# Patient Record
Sex: Female | Born: 1960
Health system: Southern US, Community
[De-identification: ages and names within clinical notes are randomized; demographics above are authoritative.]

## PROBLEM LIST (undated history)

## (undated) DIAGNOSIS — C801 Malignant (primary) neoplasm, unspecified: Secondary | ICD-10-CM

## (undated) DIAGNOSIS — E785 Hyperlipidemia, unspecified: Secondary | ICD-10-CM

## (undated) DIAGNOSIS — T7840XA Allergy, unspecified, initial encounter: Secondary | ICD-10-CM

## (undated) DIAGNOSIS — I10 Essential (primary) hypertension: Secondary | ICD-10-CM

## (undated) DIAGNOSIS — L719 Rosacea, unspecified: Secondary | ICD-10-CM

## (undated) DIAGNOSIS — E119 Type 2 diabetes mellitus without complications: Secondary | ICD-10-CM

## (undated) HISTORY — DX: Type 2 diabetes mellitus without complications: E11.9

## (undated) HISTORY — DX: Rosacea, unspecified: L71.9

## (undated) HISTORY — DX: Essential (primary) hypertension: I10

## (undated) HISTORY — DX: Hyperlipidemia, unspecified: E78.5

## (undated) HISTORY — DX: Allergy, unspecified, initial encounter: T78.40XA

---

## 1992-03-11 HISTORY — PX: CHOLECYSTECTOMY: SHX55

## 2000-06-02 ENCOUNTER — Other Ambulatory Visit: Admission: RE | Admit: 2000-06-02 | Discharge: 2000-06-02 | Payer: Self-pay | Admitting: Internal Medicine

## 2000-06-17 ENCOUNTER — Encounter: Admission: RE | Admit: 2000-06-17 | Discharge: 2000-09-15 | Payer: Self-pay | Admitting: Internal Medicine

## 2001-10-12 ENCOUNTER — Encounter: Payer: Self-pay | Admitting: *Deleted

## 2001-10-12 ENCOUNTER — Encounter: Admission: RE | Admit: 2001-10-12 | Discharge: 2001-10-12 | Payer: Self-pay | Admitting: *Deleted

## 2004-08-30 ENCOUNTER — Ambulatory Visit (HOSPITAL_COMMUNITY): Admission: RE | Admit: 2004-08-30 | Discharge: 2004-08-30 | Payer: Self-pay | Admitting: Internal Medicine

## 2005-02-12 ENCOUNTER — Other Ambulatory Visit: Admission: RE | Admit: 2005-02-12 | Discharge: 2005-02-12 | Payer: Self-pay | Admitting: Internal Medicine

## 2005-10-25 ENCOUNTER — Ambulatory Visit: Payer: Self-pay | Admitting: Family Medicine

## 2005-11-09 ENCOUNTER — Ambulatory Visit: Payer: Self-pay | Admitting: Family Medicine

## 2005-12-09 ENCOUNTER — Ambulatory Visit: Payer: Self-pay | Admitting: Family Medicine

## 2006-10-21 ENCOUNTER — Ambulatory Visit: Payer: Self-pay

## 2008-06-08 ENCOUNTER — Ambulatory Visit: Payer: Self-pay | Admitting: Family Medicine

## 2010-03-07 ENCOUNTER — Ambulatory Visit: Payer: Self-pay

## 2010-10-03 ENCOUNTER — Ambulatory Visit: Payer: Self-pay | Admitting: Family Medicine

## 2011-03-02 ENCOUNTER — Ambulatory Visit: Payer: Self-pay | Admitting: Internal Medicine

## 2011-09-25 ENCOUNTER — Ambulatory Visit: Payer: Self-pay | Admitting: Family Medicine

## 2014-10-03 ENCOUNTER — Ambulatory Visit: Payer: Self-pay

## 2014-10-03 NOTE — Patient Outreach (Signed)
[facility]) Care Management  10/03/2014  Carolyn Levine 1960-05-09 [id number]   Patient sent an e-mail cancelling today's visit because of a conflict with her work schedule- she would like to reschedule.    Gentry Fitz, RN, BA, [place], [place] Direct Dial:  [phone]  Fax:  [phone] E-mail: [email or web address] [address], [place], [place]  [address]

## 2014-10-25 ENCOUNTER — Other Ambulatory Visit: Payer: Self-pay

## 2014-10-25 NOTE — Patient Outreach (Signed)
[facility]) Care Management  10/25/2014  Carolyn Levine 11-Jan-1961 [id number]   Sent an e-mail this morning requesting Carolyn Levine reschedule her visit from 10/03/14 that she had to reschedule.   Gentry Fitz, RN, BA, [place], [place] Direct Dial:  [phone]  Fax:  [phone] E-mail: [name] [address], [place], [place]  [address]

## 2015-01-09 ENCOUNTER — Encounter: Payer: Self-pay | Admitting: Physician Assistant

## 2015-01-09 ENCOUNTER — Ambulatory Visit: Payer: Self-pay | Admitting: Physician Assistant

## 2015-01-09 VITALS — BP 134/80 | HR 76 | Temp 98.1°F

## 2015-01-09 DIAGNOSIS — J019 Acute sinusitis, unspecified: Secondary | ICD-10-CM

## 2015-01-09 MED ORDER — AMOXICILLIN 875 MG PO TABS: 875.0000 mg | ORAL_TABLET | Freq: Two times a day (BID) | ORAL | Status: DC

## 2015-01-09 NOTE — Progress Notes (Signed)
S: C/o runny nose and congestion for 3 days,r eye was matted with green mucus this morning, no drainage from eye later today;  no fever, chills, cp/sob, v/d; mucus is green and thick, c/o of facial and dental pain.   Using otc meds:   O: PE: vitals wnl, nad, perrl eomi, normocephalic, tms dull, nasal mucosa red and swollen, throat injected, neck supple no lymph, lungs c t a, cv rrr, neuro intact  A:  Acute sinusitis   P: amoxil 875mg  bid x 10d drink fluids, continue regular meds , use otc meds of choice, return if not improving in 5 days, return earlier if worsening

## 2015-02-14 ENCOUNTER — Other Ambulatory Visit: Payer: Self-pay

## 2015-02-14 NOTE — Patient Outreach (Signed)
[facility]) Care Management  02/14/2015  Carolyn Levine 20-Dec-1960 [id number]   I have sent an e-mail to Lisi requesting she follow up with me- I have some appointments available on December 28th.     Gentry Fitz, RN, BA, [place], [place] Direct Dial:  [phone]  Fax:  [phone] E-mail: [email or web address] [address] [place], [place]  [address]

## 2015-03-05 ENCOUNTER — Telehealth: Payer: 59 | Admitting: Nurse Practitioner

## 2015-03-05 DIAGNOSIS — J0100 Acute maxillary sinusitis, unspecified: Secondary | ICD-10-CM | POA: Diagnosis not present

## 2015-03-05 MED ORDER — AMOXICILLIN-POT CLAVULANATE 875-125 MG PO TABS: 1.0000 | ORAL_TABLET | Freq: Two times a day (BID) | ORAL | Status: DC

## 2015-03-05 NOTE — Progress Notes (Signed)

## 2015-03-08 ENCOUNTER — Other Ambulatory Visit: Payer: Self-pay

## 2015-03-08 VITALS — BP 146/76 | Ht 65.0 in | Wt 307.0 lb

## 2015-03-08 DIAGNOSIS — E785 Hyperlipidemia, unspecified: Secondary | ICD-10-CM | POA: Insufficient documentation

## 2015-03-08 DIAGNOSIS — IMO0001 Reserved for inherently not codable concepts without codable children: Secondary | ICD-10-CM

## 2015-03-08 DIAGNOSIS — E1165 Type 2 diabetes mellitus with hyperglycemia: Principal | ICD-10-CM

## 2015-03-08 DIAGNOSIS — E1169 Type 2 diabetes mellitus with other specified complication: Secondary | ICD-10-CM | POA: Insufficient documentation

## 2015-03-08 DIAGNOSIS — E119 Type 2 diabetes mellitus without complications: Secondary | ICD-10-CM | POA: Insufficient documentation

## 2015-03-08 NOTE — Patient Outreach (Signed)
[facility]) Care Management  [place]  03/08/2015   Carolyn Levine May 02, 1960 [id number]  Subjective: Carolyn Levine was in for her reguarly scheduled Link to Wellness visit.  She saw MD in October and her A1C was slightly elevated at 7.2%- she reports a direct correlation between her decreased exercise and a higher A1C.  She has already developed an exercise program with her co-workers and has joined the [employer]. She has been cleared by her MD and plans on going to exercise classes 2x/week and exercising on her own, two times per week. She is aiming for 30 minutes each time.  Lately she has been walking 1/2 mile with her dog in the morning and 1/2 mile on her own- sometimes she walks after work as well but she reports not being as diligent about exercising.  She has no complaints of pain or depression but she currently has a cold and an ear infection- she is on Augmentin.   Objective:  Filed Vitals:   03/08/15 0841  BP: 146/76  Height: 1.651 m (5\' 5" )  Weight: 307 lb (139.254 kg)     Current Medications:  Current Outpatient Prescriptions  Medication Sig Dispense Refill  . amoxicillin-clavulanate (AUGMENTIN) 875-125 MG tablet Take 1 tablet by mouth 2 (two) times daily. 20 tablet 0  . lisinopril (PRINIVIL,ZESTRIL) 10 MG tablet Take 10 mg by mouth daily.    . metFORMIN (GLUCOPHAGE) 1000 MG tablet Take 1,000 mg by mouth 2 (two) times daily with a meal.    . rosuvastatin (CRESTOR) 5 MG tablet Take 5 mg by mouth daily. Takes every three days    . amoxicillin (AMOXIL) 875 MG tablet Take 1 tablet (875 mg total) by mouth 2 (two) times daily. (Patient not taking: Reported on 03/08/2015) 20 tablet 0   No current facility-administered medications for this visit.    Functional Status:  In your present state of health, do you have any difficulty performing the following activities: 03/08/2015  Hearing? N  Vision? N  Difficulty concentrating or making decisions? N  Walking  or climbing stairs? N  Dressing or bathing? N  Doing errands, shopping? N    Fall/Depression Screening: PHQ 2/9 Scores 03/08/2015 12/15/2013  PHQ - 2 Score 0 0    Assessment: Margory has proactively made some changes to help lower her A1C.  She continues to drink unsweetened drinks and bring her lunch and snacks to work. She tries to eat a well-balanced diet paying attention to carbs and proteins.  She has started to keep track of consuming 4- 16 oz glasses of water every day. She has completed her HCPOA.   Plan:  [facility] CM Care Plan Problem One        Most Recent Value   Care Plan Problem One  Elevated A1C greater than goal of 7%   Role Documenting the Problem One  Care Management [place] for Problem One  Active   [facility] Long Term Goal (31-90 days)  Obtain an A1C of 7% or less when rechecked at next MD visit in the spring 2017   [place] Term Goal Start Date  03/08/15   Interventions for Problem One Long Term Goal  1. exercise 4x/week for at least 30 minutes- 2 clesses and 2x on your own 2. drink 4- 16 oz glasses of water per day     Follow up in 6 months.   Gentry Fitz, RN, BA, [place], [place] Diabetes Coordinator- [employer]  To Wellness Direct Dial:  [phone]  Fax:  [phone] E-mail: [email or web address] [address] [place], [place]  [address]

## 2015-03-13 ENCOUNTER — Telehealth: Payer: 59 | Admitting: Family

## 2015-03-13 DIAGNOSIS — B373 Candidiasis of vulva and vagina: Secondary | ICD-10-CM

## 2015-03-13 DIAGNOSIS — B3731 Acute candidiasis of vulva and vagina: Secondary | ICD-10-CM

## 2015-03-13 MED ORDER — FLUCONAZOLE 150 MG PO TABS: 150.0000 mg | ORAL_TABLET | Freq: Once | ORAL | Status: DC

## 2015-03-13 NOTE — Progress Notes (Signed)

## 2015-03-31 DIAGNOSIS — E119 Type 2 diabetes mellitus without complications: Secondary | ICD-10-CM | POA: Diagnosis not present

## 2015-05-26 ENCOUNTER — Ambulatory Visit: Payer: Self-pay | Admitting: Physician Assistant

## 2015-05-26 ENCOUNTER — Encounter: Payer: Self-pay | Admitting: Physician Assistant

## 2015-05-26 VITALS — BP 154/80 | HR 88 | Temp 99.8°F

## 2015-05-26 DIAGNOSIS — J069 Acute upper respiratory infection, unspecified: Secondary | ICD-10-CM

## 2015-05-26 DIAGNOSIS — R509 Fever, unspecified: Secondary | ICD-10-CM

## 2015-05-26 LAB — POCT INFLUENZA A/B
Influenza A, POC: NEGATIVE
Influenza B, POC: NEGATIVE

## 2015-05-26 MED ORDER — AMOXICILLIN-POT CLAVULANATE 875-125 MG PO TABS: 1.0000 | ORAL_TABLET | Freq: Two times a day (BID) | ORAL | Status: DC

## 2015-05-26 MED ORDER — HYDROCOD POLST-CPM POLST ER 10-8 MG/5ML PO SUER: 5.0000 mL | Freq: Two times a day (BID) | ORAL | Status: DC | PRN

## 2015-05-26 MED ORDER — FLUCONAZOLE 150 MG PO TABS: 150.0000 mg | ORAL_TABLET | Freq: Once | ORAL | Status: DC

## 2015-05-26 NOTE — Progress Notes (Signed)
S: C/o runny nose and congestion with dry cough for 3 days, + fever, chills, denies cp/sob, v/d; mucus was green this am but clear throughout the day, cough is sporadic, nephew stayed with them last weekend and has the flu  Using otc meds: robitussin  O: PE: vitals wnl, nad,  perrl eomi, normocephalic, tms dull, nasal mucosa red and swollen, throat injected, neck supple no lymph, lungs c t a, cv rrr, neuro intact, flu swab neg  A:  Acute flu like illness   P: trial of augmentin 875mg  , tussionex 138ml nr, diflucan; drink fluids, continue regular meds , use otc meds of choice, return if not improving in 5 days, return earlier if worsening

## 2015-08-15 ENCOUNTER — Other Ambulatory Visit: Payer: Self-pay

## 2015-08-15 VITALS — BP 124/70 | HR 78 | Ht 65.0 in | Wt 301.3 lb

## 2015-08-15 DIAGNOSIS — E1165 Type 2 diabetes mellitus with hyperglycemia: Principal | ICD-10-CM

## 2015-08-15 DIAGNOSIS — IMO0001 Reserved for inherently not codable concepts without codable children: Secondary | ICD-10-CM

## 2015-08-15 NOTE — Patient Outreach (Signed)
[facility]) Care Management  [place]  08/15/2015   Carolyn Levine 26-Aug-1960 [id number]  Subjective: Patient in for her routine Link to Wellness visit- she reports fasting blood sugars of 120-135mg /dl and 2 hour post prandial blood sugars of 130-161mg /dl.  She tells me she's going to [facility] for vacation and is increasing her stamina by exercising most morning for 20 minutes and most evenings for 20 minutes.  She has no complaints.  She will have labs drawn tomorrow and see the MD next week. She tries to eat following the diabetic guidelines and motivates/encourages others to keep her workplace void of poor dietary choices.  Objective:  Filed Vitals:   08/15/15 0837 08/15/15 0840  BP: 124/70   Pulse: 78   Height: 1.651 m (5\' 5" )   Weight: 301 lb 4.8 oz (136.669 kg)   SpO2:  98%   Visible weight loss- down from 307lbs in December 2016.  Encounter Medications:  Outpatient Encounter Prescriptions as of 08/15/2015  Medication Sig  . lisinopril (PRINIVIL,ZESTRIL) 10 MG tablet Take 10 mg by mouth daily.  . metFORMIN (GLUCOPHAGE) 1000 MG tablet Take 1,000 mg by mouth 2 (two) times daily with a meal.  . rosuvastatin (CRESTOR) 5 MG tablet Take 5 mg by mouth daily. Takes every three days  . amoxicillin-clavulanate (AUGMENTIN) 875-125 MG tablet Take 1 tablet by mouth 2 (two) times daily. for 10 days (Patient not taking: Reported on 08/15/2015)  . chlorpheniramine-HYDROcodone (TUSSIONEX PENNKINETIC ER) 10-8 MG/5ML SUER Take 5 mLs by mouth every 12 (twelve) hours as needed for cough. (Patient not taking: Reported on 08/15/2015)  . fluconazole (DIFLUCAN) 150 MG tablet Take 1 tablet (150 mg total) by mouth once. (Patient not taking: Reported on 08/15/2015)   No facility-administered encounter medications on file as of 08/15/2015.    Functional Status:  In your present state of health, do you have any difficulty performing the following activities: 08/15/2015 03/08/2015  Hearing?  N N  Vision? N N  Difficulty concentrating or making decisions? N N  Walking or climbing stairs? N N  Dressing or bathing? N N  Doing errands, shopping? N N    Fall/Depression Screening: PHQ 2/9 Scores 08/15/2015 03/08/2015 12/15/2013  PHQ - 2 Score 0 0 0    Assessment: Currently motivated and appears to be making healthy choices- drinking plenty of water, 1 diet soda per day and consistent exercise. She does question wether she might have sleep apnea and will discuss with her MD at her next visit. (she complains of being tired but has been working 7am to 5:30pm most days)  Plan:  [facility] CM Care Plan Problem One        Most Recent Value   Care Plan Problem One  Elevated A1C greater than goal of 7%   Role Documenting the Problem One  Care Management [place] for Problem One  Active   [facility] Long Term Goal (31-90 days)  Obtain an A1C of 7% or less when rechecked at next MD visit in the spring 2017   [place] Term Goal Start Date  03/08/15 [missed March appointment- rescheduled to next week]   Interventions for Problem One Long Term Goal  1. exercise 4x/week for at least 30 minutes- drink 16 oz glasses of water per day     Schedule mammogram and OB/Gyn visit Continue to exercise most evenings but increase each week by 5 minutes/day. Follow up with me in 6 months.   Gentry Fitz,  RN, [place], [place], [place]:  [phone]  Fax:  [phone] E-mail: [email or web address] [address], [place], [place]  [address]

## 2015-08-16 ENCOUNTER — Other Ambulatory Visit: Payer: Self-pay | Admitting: Family Medicine

## 2015-08-16 DIAGNOSIS — E119 Type 2 diabetes mellitus without complications: Secondary | ICD-10-CM | POA: Diagnosis not present

## 2015-08-16 DIAGNOSIS — E785 Hyperlipidemia, unspecified: Secondary | ICD-10-CM | POA: Diagnosis not present

## 2015-08-16 DIAGNOSIS — Z1231 Encounter for screening mammogram for malignant neoplasm of breast: Secondary | ICD-10-CM

## 2015-08-22 DIAGNOSIS — I10 Essential (primary) hypertension: Secondary | ICD-10-CM | POA: Diagnosis not present

## 2015-08-22 DIAGNOSIS — E1165 Type 2 diabetes mellitus with hyperglycemia: Secondary | ICD-10-CM | POA: Diagnosis not present

## 2015-08-22 DIAGNOSIS — G4700 Insomnia, unspecified: Secondary | ICD-10-CM | POA: Diagnosis not present

## 2015-08-22 DIAGNOSIS — E785 Hyperlipidemia, unspecified: Secondary | ICD-10-CM | POA: Diagnosis not present

## 2015-08-29 ENCOUNTER — Ambulatory Visit
Admission: RE | Admit: 2015-08-29 | Discharge: 2015-08-29 | Disposition: A | Discharge status: Home / Self Care | Payer: 59 | Source: Ambulatory Visit | Attending: Family Medicine | Admitting: Family Medicine

## 2015-08-29 ENCOUNTER — Other Ambulatory Visit: Payer: Self-pay | Admitting: Family Medicine

## 2015-08-29 DIAGNOSIS — Z1231 Encounter for screening mammogram for malignant neoplasm of breast: Secondary | ICD-10-CM

## 2016-01-18 DIAGNOSIS — E1165 Type 2 diabetes mellitus with hyperglycemia: Secondary | ICD-10-CM | POA: Diagnosis not present

## 2016-01-24 DIAGNOSIS — Z6841 Body Mass Index (BMI) 40.0 and over, adult: Secondary | ICD-10-CM | POA: Diagnosis not present

## 2016-01-24 DIAGNOSIS — I10 Essential (primary) hypertension: Secondary | ICD-10-CM | POA: Diagnosis not present

## 2016-01-24 DIAGNOSIS — E1165 Type 2 diabetes mellitus with hyperglycemia: Secondary | ICD-10-CM | POA: Diagnosis not present

## 2016-06-04 ENCOUNTER — Encounter: Payer: Self-pay | Admitting: Physician Assistant

## 2016-06-04 ENCOUNTER — Ambulatory Visit: Payer: Self-pay | Admitting: Physician Assistant

## 2016-06-04 VITALS — BP 138/70 | HR 97 | Temp 97.9°F

## 2016-06-04 DIAGNOSIS — I10 Essential (primary) hypertension: Secondary | ICD-10-CM | POA: Insufficient documentation

## 2016-06-04 DIAGNOSIS — E669 Obesity, unspecified: Secondary | ICD-10-CM | POA: Insufficient documentation

## 2016-06-04 DIAGNOSIS — D649 Anemia, unspecified: Secondary | ICD-10-CM | POA: Insufficient documentation

## 2016-06-04 DIAGNOSIS — J0100 Acute maxillary sinusitis, unspecified: Secondary | ICD-10-CM

## 2016-06-04 DIAGNOSIS — E785 Hyperlipidemia, unspecified: Secondary | ICD-10-CM | POA: Insufficient documentation

## 2016-06-04 NOTE — Progress Notes (Signed)
38/70 

## 2016-06-04 NOTE — Progress Notes (Signed)
S: C/o runny nose and congestion for 4 days, no fever, chills, cp/sob, v/d; mucus is yellow and thick when she is able to get it out, cough is sporadic, worse at night, tussionex didn't help much last night; delsym seems to be helping this am  Using otc meds: delsym  O: PE: vitals wnl, nad, perrl eomi, normocephalic, tms dull, nasal mucosa red and swollen, throat injected, neck supple no lymph, lungs c t a, cv rrr, neuro intact  A:  Acute sinusitis   P: drink fluids, continue regular meds , use otc meds of choice, return if not improving in 5 days, return earlier if worsening, if not better by Thursday will call in an antibiotic

## 2016-07-03 DIAGNOSIS — E1165 Type 2 diabetes mellitus with hyperglycemia: Secondary | ICD-10-CM | POA: Diagnosis not present

## 2016-07-10 DIAGNOSIS — I10 Essential (primary) hypertension: Secondary | ICD-10-CM | POA: Diagnosis not present

## 2016-07-10 DIAGNOSIS — E119 Type 2 diabetes mellitus without complications: Secondary | ICD-10-CM | POA: Diagnosis not present

## 2016-07-10 DIAGNOSIS — E785 Hyperlipidemia, unspecified: Secondary | ICD-10-CM | POA: Diagnosis not present

## 2017-02-03 NOTE — Patient Outreach (Signed)
[facility]) Care Management  02/03/2017  Carolyn Levine Aug 24, 1960 [id number]   I have removed myself from the care team for Link to Wellness.  I will continue to assist Carolyn Levine in managing her diabetes through the [place] called [place].   Gentry Fitz, RN, BA, [place], [place] Direct Dial:  [phone]  Fax:  [phone] E-mail: [email or web address] [address] [place], [place]  [address]

## 2017-05-26 DIAGNOSIS — H0011 Chalazion right upper eyelid: Secondary | ICD-10-CM | POA: Diagnosis not present

## 2017-07-16 DIAGNOSIS — E119 Type 2 diabetes mellitus without complications: Secondary | ICD-10-CM | POA: Diagnosis not present

## 2017-09-01 DIAGNOSIS — I10 Essential (primary) hypertension: Secondary | ICD-10-CM | POA: Diagnosis not present

## 2017-09-01 DIAGNOSIS — E119 Type 2 diabetes mellitus without complications: Secondary | ICD-10-CM | POA: Diagnosis not present

## 2017-09-01 DIAGNOSIS — E785 Hyperlipidemia, unspecified: Secondary | ICD-10-CM | POA: Diagnosis not present

## 2017-09-03 DIAGNOSIS — E1165 Type 2 diabetes mellitus with hyperglycemia: Secondary | ICD-10-CM | POA: Diagnosis not present

## 2017-09-03 DIAGNOSIS — E785 Hyperlipidemia, unspecified: Secondary | ICD-10-CM | POA: Diagnosis not present

## 2017-09-03 DIAGNOSIS — I10 Essential (primary) hypertension: Secondary | ICD-10-CM | POA: Diagnosis not present

## 2017-09-10 ENCOUNTER — Other Ambulatory Visit: Payer: Self-pay | Admitting: Family Medicine

## 2017-09-10 DIAGNOSIS — Z1231 Encounter for screening mammogram for malignant neoplasm of breast: Secondary | ICD-10-CM

## 2017-09-30 ENCOUNTER — Ambulatory Visit
Admission: RE | Admit: 2017-09-30 | Discharge: 2017-09-30 | Disposition: A | Discharge status: Home / Self Care | Payer: 59 | Source: Ambulatory Visit | Attending: Family Medicine | Admitting: Family Medicine

## 2017-09-30 DIAGNOSIS — Z1231 Encounter for screening mammogram for malignant neoplasm of breast: Secondary | ICD-10-CM | POA: Diagnosis not present

## 2018-01-19 ENCOUNTER — Ambulatory Visit (INDEPENDENT_AMBULATORY_CARE_PROVIDER_SITE_OTHER): Payer: Self-pay | Admitting: Physician Assistant

## 2018-01-19 VITALS — BP 130/90 | HR 95 | Temp 98.2°F | Resp 18 | Wt 299.0 lb

## 2018-01-19 DIAGNOSIS — R05 Cough: Secondary | ICD-10-CM

## 2018-01-19 DIAGNOSIS — H1031 Unspecified acute conjunctivitis, right eye: Secondary | ICD-10-CM

## 2018-01-19 DIAGNOSIS — R059 Cough, unspecified: Secondary | ICD-10-CM

## 2018-01-19 DIAGNOSIS — J069 Acute upper respiratory infection, unspecified: Secondary | ICD-10-CM

## 2018-01-19 MED ORDER — POLYMYXIN B-TRIMETHOPRIM 10000-0.1 UNIT/ML-% OP SOLN: 2.0000 [drp] | Freq: Four times a day (QID) | OPHTHALMIC | 0 refills | Status: AC

## 2018-01-19 MED ORDER — ALBUTEROL SULFATE HFA 108 (90 BASE) MCG/ACT IN AERS: 2.0000 | INHALATION_SPRAY | RESPIRATORY_TRACT | 0 refills | Status: DC | PRN

## 2018-01-19 MED ORDER — GUAIFENESIN-DM 100-10 MG/5ML PO SYRP: 5.0000 mL | ORAL_SOLUTION | ORAL | 0 refills | Status: DC | PRN

## 2018-01-19 NOTE — Progress Notes (Signed)
Patient ID: ALYCE INSCORE DOB: [birth year] AGE: 57 y.o. MRN: [id number]   PCP: [name], MD   Chief Complaint:  Chief Complaint  Patient presents with  . cough, drainagex5     Subjective:    HPI:  Carolyn Levine is a 57 y.o. female presents for evaluation  Chief Complaint  Patient presents with  . cough, drainagex71    57 year old female presents to [facility] with six day history of URI symptoms. Began with nasal congestion and rhinorrhea. Patient used NettiPot. Over the weekend developed chills and cough. Cough coarse and productive. Episode of audible wheezing. Cough worse in the evening; causing difficulty sleeping. Cough also triggered by talking. Not triggered by physical exertion; no associated shortness of breath. Patient yesterday and today developed right eye discharge with associated pressure/swollen sensation. Reports right eye pruritis and burning sensation. Also bilateral ear fullness/pressure. Has taken over the counter Dayquil and used previously prescribed Tessalon Perles with no improvement. Denies fever, headache, ear pain, sinus pain, sore throat, chest pain. No asthma history. Patient denies change in vision, foreign body sensation, significant eye pain/discomfort, photophobia.  Patient on physical examination has resolving/scabbed-over cold sore on midline lower lip. States presented few days prior to onset of cold symptoms. Patient used over the counter Blistex. No previous history of herpes simplex. Husband with history of herpes simplex.  Patient with non-insulin dependent diabetes mellitus. Last A1C one week ago, 6.3. Controlled on Metformin.  A complete, at least 10 system review of symptoms was performed, pertinent positives and negatives as mentioned in HPI, otherwise negative.  The following portions of the patient's history were reviewed and updated as appropriate: allergies, current medications and past medical history.  Patient  Active Problem List   Diagnosis Date Noted  . Hyperlipidemia, unspecified 06/04/2016  . Hypertension 06/04/2016  . Obesity 06/04/2016  . Anemia 06/04/2016  . Type 2 diabetes mellitus ([place]) 03/08/2015    Allergies  Allergen Reactions  . Codeine Sulfate   . Simvastatin     Other reaction(s): Muscle Pain  . Sulphadimidine [Sulfamethazine]     Current Outpatient Medications on File Prior to Visit  Medication Sig Dispense Refill  . lisinopril (PRINIVIL,ZESTRIL) 10 MG tablet Take 10 mg by mouth daily.    . metFORMIN (GLUCOPHAGE) 1000 MG tablet Take 1,000 mg by mouth 2 (two) times daily with a meal.    . rosuvastatin (CRESTOR) 5 MG tablet Take 5 mg by mouth daily. Takes every three days     No current facility-administered medications on file prior to visit.        Objective:   Vitals:   01/19/18 0926  BP: 130/90  Pulse: 95  Resp: 18  Temp: 98.2 F (36.8 C)  SpO2: 97%     Wt Readings from Last 3 Encounters:  01/19/18 299 lb (135.6 kg)  08/15/15 (!) 301 lb 4.8 oz (136.7 kg)  03/08/15 (!) 307 lb (139.3 kg)    Physical Exam:   General Appearance:  Alert, cooperative, appears stated age. In no acute distress. Afebrile.  Head:  Normocephalic, without obvious abnormality, atraumatic  Eyes:  PERRL, EOM's intact, fundi benign, both eyes. Bilateral conjunctiva reveals mild injection. No diffuse erythema. No chemosis. No perilimbal flushing. Right lower eyelid with minimal edema. No visible purulent drainage at this time.  Ears:  Normal external ear canals, both ears. Bilateral TMs reveal serous effusion. No erythema. No injection.  Nose: Nares normal, septum midline. Clear rhinorrhea. Normal mucosa. No sinus tenderness  with percussion/palpation.  Throat: Lips, mucosa, and tongue normal; teeth and gums normal. Throat reveals no erythema. Tonsils with no enlargement or exudate.  Neck: Supple, symmetrical, trachea midline, mild bilateral anterior cervical lymphadenopathy;  thyroid:  not enlarged, symmetric, no tenderness/mass/nodules; no carotid bruit or JVD  Back:   Symmetric, no curvature, ROM normal, no CVA tenderness  Lungs:   Clear to auscultation bilaterally, respirations unlabored. No wheezing with forced expiration. No cough elicited with deep inspiration.  Heart:  Regular rate and rhythm, S1 and S2 normal, no murmur, rub, or gallop  Abdomen:   Soft, non-tender, bowel sounds active all four quadrants,  no masses, no organomegaly  Extremities: Extremities normal, atraumatic, no cyanosis or edema  Pulses: 2+ and symmetric  Skin: Skin color, texture, turgor normal, no rashes or lesions  Lymph nodes: Cervical, supraclavicular, and axillary nodes normal  Neurologic: Normal    Assessment & Plan:    Exam findings, diagnosis etiology and medication use and indications reviewed with patient. Follow-Up and discharge instructions provided. No emergent/urgent issues found on exam.  Patient education was provided.   Patient verbalized understanding of information provided and agrees with plan of care (POC), all questions answered. The patient is advised to call or return to clinic if condition does not see an improvement in symptoms, or to seek the care of the closest emergency department if condition worsens with the below plan.   1. Upper respiratory tract infection, unspecified type  2. Cough  - albuterol (PROVENTIL HFA;VENTOLIN HFA) 108 (90 Base) MCG/ACT inhaler; Inhale 2 puffs into the lungs every 4 (four) hours as needed for wheezing or shortness of breath.  Dispense: 1 Inhaler; Refill: 0 - guaiFENesin-dextromethorphan (ROBITUSSIN DM) 100-10 MG/5ML syrup; Take 5 mLs by mouth every 4 (four) hours as needed for cough.  Dispense: 118 mL; Refill: 0  3. Acute conjunctivitis of right eye, unspecified acute conjunctivitis type  - trimethoprim-polymyxin b (POLYTRIM) ophthalmic solution; Place 2 drops into the right eye every 6 (six) hours for 5 days.  Dispense: 10 mL;  Refill: 0   Patient with six day history of URI symptoms. Suspect viral etiology due to precipitating cold sore and associated conjunctivitis. Will treat cough with Robitussin-DM and albuterol inhaler. Prescribed antibiotic eye drop due to concern for bacterial conjunctivitis. No oral antibiotic at this time. Advised patient try conservative treatment; is to f/u or call [facility] in a few days if symptoms not improving. At that time, suspect will prescribed Amoxicillin or Augmentin for sinusitis. Patient agrees with plan.   Darlin Priestly, MHS, PA-C Montey Hora, MHS, PA-C Advanced Practice Provider [facility]  [address] [facility], [place], [place] [address] (p):  [phone] [email or web address]

## 2018-01-19 NOTE — Patient Instructions (Signed)
Thank you for choosing [facility] for your health care needs.  You have been diagnosed with an upper respiratory infection.  Recommend you increase fluids. Rest. May use over the counter Tylenol or ibuprofen for headache / fever.  Continue to use saline nasal spray / NettiPot for nasal congestion and sinus pressure.  You have been prescribed an eye drop, Polytrim. You have been prescribed a cough syrup. You have also been prescribed an inhaler, albuterol (Proventil / Ventolin). Use as directed.  Follow-up at [facility] or with your family physician if symptoms do not begin to improve over the next couple days.  Hope you feel better soon!  Upper Respiratory Infection, Adult Most upper respiratory infections (URIs) are caused by a virus. A URI affects the nose, throat, and upper air passages. The most common type of URI is often called "the common cold." Follow these instructions at home:  Take medicines only as told by your doctor.  Gargle warm saltwater or take cough drops to comfort your throat as told by your doctor.  Use a warm mist humidifier or inhale steam from a shower to increase air moisture. This may make it easier to breathe.  Drink enough fluid to keep your pee (urine) clear or pale yellow.  Eat soups and other clear broths.  Have a healthy diet.  Rest as needed.  Go back to work when your fever is gone or your doctor says it is okay. ? You may need to stay home longer to avoid giving your URI to others. ? You can also wear a face mask and wash your hands often to prevent spread of the virus.  Use your inhaler more if you have asthma.  Do not use any tobacco products, including cigarettes, chewing tobacco, or electronic cigarettes. If you need help quitting, ask your doctor. Contact a doctor if:  You are getting worse, not better.  Your symptoms are not helped by medicine.  You have chills.  You are getting more short of breath.  You have brown or red  mucus.  You have yellow or brown discharge from your nose.  You have pain in your face, especially when you bend forward.  You have a fever.  You have puffy (swollen) neck glands.  You have pain while swallowing.  You have white areas in the back of your throat. Get help right away if:  You have very bad or constant: ? Headache. ? Ear pain. ? Pain in your forehead, behind your eyes, and over your cheekbones (sinus pain). ? Chest pain.  You have long-lasting (chronic) lung disease and any of the following: ? Wheezing. ? Long-lasting cough. ? Coughing up blood. ? A change in your usual mucus.  You have a stiff neck.  You have changes in your: ? Vision. ? Hearing. ? Thinking. ? Mood. This information is not intended to replace advice given to you by your health care provider. Make sure you discuss any questions you have with your health care provider. Document Released: 08/14/2007 Document Revised: 10/29/2015 Document Reviewed: 06/02/2013 Elsevier Interactive Patient Education  2018 [employer].

## 2018-01-21 ENCOUNTER — Telehealth: Payer: Self-pay | Admitting: Emergency Medicine

## 2018-01-21 NOTE — Telephone Encounter (Signed)
Patient informed of recommedation per provider Acknowledge understanding

## 2018-01-21 NOTE — Telephone Encounter (Signed)
For persistent cough, recommend patient ensure to use albuterol inhaler (2 puffs every 4 hours when awake). May use Robitussin-DM, which was prescribed. If not helping, may use over-the-counter Delsym instead. May wish to prop self up with several pillows at night, sometimes helpful. May wish to use a humidifier in the bedroom. Honey has also been shown to helpful with cough.  Cough can take a long time to resolve with URIs/colds. If patient continues to feel cough is not improving, may follow-up with family physician or at [facility] for re-evaluation.  Thank you, SFS PA-C

## 2018-01-23 ENCOUNTER — Ambulatory Visit (INDEPENDENT_AMBULATORY_CARE_PROVIDER_SITE_OTHER): Payer: Self-pay | Admitting: Physician Assistant

## 2018-01-23 ENCOUNTER — Encounter: Payer: Self-pay | Admitting: Physician Assistant

## 2018-01-23 VITALS — BP 134/90 | HR 88 | Temp 99.1°F | Wt 299.0 lb

## 2018-01-23 DIAGNOSIS — J40 Bronchitis, not specified as acute or chronic: Secondary | ICD-10-CM

## 2018-01-23 MED ORDER — PREDNISONE 10 MG PO TABS: ORAL_TABLET | ORAL | 0 refills | Status: DC

## 2018-01-23 MED ORDER — DOXYCYCLINE HYCLATE 100 MG PO TABS: 100.0000 mg | ORAL_TABLET | Freq: Two times a day (BID) | ORAL | 0 refills | Status: AC

## 2018-01-23 NOTE — Progress Notes (Addendum)
Patient ID: Carolyn Levine DOB: July 06, 1960 AGE: 57 y.o. MRN: [id number]   PCP: [name], MD   Chief Complaint: Cough  Subjective:    HPI:  Carolyn Levine is a 57 y.o. female presents for evaluation of cough.  Patient originally seen at [facility] on Monday 01/19/2018 for six day history of URI symptoms. Patient with nasal congestion, ear fullness/pressure, right eye pruritis/burning, and cough. Prescribed albuterol inhaler and Robitussin-DM for cough and Polytrim eye drops for right eye conjunctivitis.   Patient returns today, four days later, with worsening cough. States cough has become loose; now productive, thick yellow phlemg after coughing fit. Cough worse at night; begins at like 5pm, lasts all night, causes difficulty sleeping. Also elicited with talking. Continues rhinorrhea. Denies nasal congestion, sinus pressure, or ear pressure. States eye symptoms have resolved. Denies fever, chills, headache, body aches, chest pain, SOB, wheezing. Cough persistent/nagging; has begun to annoy her co-workers.  A complete, at least 10 system review of symptoms was performed, pertinent positives and negatives as mentioned in HPI, otherwise negative.  The following portions of the patient's history were reviewed and updated as appropriate: allergies, current medications and past medical history.  Patient Active Problem List   Diagnosis Date Noted  . Hyperlipidemia, unspecified 06/04/2016  . Hypertension 06/04/2016  . Obesity 06/04/2016  . Anemia 06/04/2016  . Type 2 diabetes mellitus ([place]) 03/08/2015    Allergies  Allergen Reactions  . Codeine Sulfate   . Simvastatin     Other reaction(s): Muscle Pain  . Sulphadimidine [Sulfamethazine]     Current Outpatient Medications on File Prior to Visit  Medication Sig Dispense Refill  . albuterol (PROVENTIL HFA;VENTOLIN HFA) 108 (90 Base) MCG/ACT inhaler Inhale 2 puffs into the lungs every 4 (four) hours as needed for  wheezing or shortness of breath. 1 Inhaler 0  . guaiFENesin-dextromethorphan (ROBITUSSIN DM) 100-10 MG/5ML syrup Take 5 mLs by mouth every 4 (four) hours as needed for cough. 118 mL 0  . lisinopril (PRINIVIL,ZESTRIL) 10 MG tablet Take 10 mg by mouth daily.    . metFORMIN (GLUCOPHAGE) 1000 MG tablet Take 1,000 mg by mouth 2 (two) times daily with a meal.    . rosuvastatin (CRESTOR) 5 MG tablet Take 5 mg by mouth daily. Takes every three days    . trimethoprim-polymyxin b (POLYTRIM) ophthalmic solution Place 2 drops into the right eye every 6 (six) hours for 5 days. 10 mL 0   No current facility-administered medications on file prior to visit.        Objective:   Vitals:   01/23/18 1242  BP: 134/90  Pulse: 88  Temp: 99.1 F (37.3 C)  SpO2: 97%     Wt Readings from Last 3 Encounters:  01/23/18 299 lb (135.6 kg)  01/19/18 299 lb (135.6 kg)  08/15/15 (!) 301 lb 4.8 oz (136.7 kg)    Physical Exam:   General Appearance:  Alert, cooperative, appears stated age. In no acute distress. Afebrile.  Head:  Normocephalic, without obvious abnormality, atraumatic  Eyes:  PERRL, conjunctiva/corneas clear, EOM's intact, fundi benign, both eyes  Ears:  Normal TM's and external ear canals, both ears  Nose: Nares normal, septum midline. No discharge. Normal mucosa. No sinus tenderness with percussion/palpation.  Throat: Lips, mucosa, and tongue normal; teeth and gums normal. Throat reveals no erythema. Tonsils with no enlargement or exudate.  Neck: Supple, symmetrical, trachea midline, no adenopathy;  thyroid: not enlarged, symmetric, no tenderness/mass/nodules; no carotid bruit or JVD  Back:  Symmetric, no curvature, ROM normal, no CVA tenderness  Lungs:   Clear to auscultation bilaterally, respirations unlabored. Decreased breath sounds in bases bilaterally. No wheezing. No cough elicited with deep inspiration. Cough elicited with talking; coarse/bronchitic.  Heart:  Regular rate and rhythm, S1  and S2 normal, no murmur, rub, or gallop  Abdomen:   Soft, non-tender, bowel sounds active all four quadrants,  no masses, no organomegaly  Extremities: Extremities normal, atraumatic, no cyanosis or edema  Pulses: 2+ and symmetric  Skin: Skin color, texture, turgor normal, no rashes or lesions  Lymph nodes: Cervical, supraclavicular, and axillary nodes normal  Neurologic: Normal    Assessment & Plan:    Exam findings, diagnosis etiology and medication use and indications reviewed with patient. Follow-Up and discharge instructions provided. No emergent/urgent issues found on exam.  Patient education was provided.   Patient verbalized understanding of information provided and agrees with plan of care (POC), all questions answered. The patient is advised to call or return to clinic if condition does not see an improvement in symptoms, or to seek the care of the closest emergency department if condition worsens with the below plan.    1. Bronchitis  - doxycycline (VIBRA-TABS) 100 MG tablet; Take 1 tablet (100 mg total) by mouth 2 (two) times daily for 10 days.  Dispense: 20 tablet; Refill: 0 - predniSONE (DELTASONE) 10 MG tablet; Take 5 tabs po qd x 2 days, then 4 tabs po qd x 2 days, then 3 tabs po qd x 2 days, then 2 tabs po qd x 2 days, then 1 tab po qd x 2 days  Dispense: 30 tablet; Refill: 0  Patient with 10 day history of URI symptoms; resulting in bronchitic cough. Prescribed antibiotic (Doxycycline) and tapering steroid (prednisone 50mg  over 10 days). Advised continuation of albuterol inhaler. Patient advised to f/u in a few days if not getting better.  Addendum 01/26/2018: Patient called reporting vaginal yeast infection due to antibiotic. Sent Diflucan to pharmacy. SFS PA-C.   Darlin Priestly, MHS, PA-C Montey Hora, MHS, PA-C Advanced Practice Provider [facility]  [address], [facility], [place], [place] [address] (p):   [phone] [email or web address]

## 2018-01-23 NOTE — Patient Instructions (Signed)
Thank you for choosing [facility] for your health care needs.  You have been diagnosed with bronchitis.  1. Bronchitis  - doxycycline (VIBRA-TABS) 100 MG tablet; Take 1 tablet (100 mg total) by mouth 2 (two) times daily for 10 days.  Dispense: 20 tablet; Refill: 0 - predniSONE (DELTASONE) 10 MG tablet; Take 5 tabs po qd x 2 days, then 4 tabs po qd x 2 days, then 3 tabs po qd x 2 days, then 2 tabs po qd x 2 days, then 1 tab po qd x 2 days  Dispense: 30 tablet; Refill: 0  Take medication as prescribed.  Increase fluids. Rest. May continue to use Robitussin-DM for cough. Continue to use albuterol inhaler: 2 puffs every 4 hours for cough/wheezing.  Follow-up with family physician or with [facility] in a few days if symptoms not improving.  Acute Bronchitis, Adult Acute bronchitis is when air tubes (bronchi) in the lungs suddenly get swollen. The condition can make it hard to breathe. It can also cause these symptoms:  A cough.  Coughing up clear, yellow, or green mucus.  Wheezing.  Chest congestion.  Shortness of breath.  A fever.  Body aches.  Chills.  A sore throat.  Follow these instructions at home: Medicines  Take over-the-counter and prescription medicines only as told by your doctor.  If you were prescribed an antibiotic medicine, take it as told by your doctor. Do not stop taking the antibiotic even if you start to feel better. General instructions  Rest.  Drink enough fluids to keep your pee (urine) clear or pale yellow.  Avoid smoking and secondhand smoke. If you smoke and you need help quitting, ask your doctor. Quitting will help your lungs heal faster.  Use an inhaler, cool mist vaporizer, or humidifier as told by your doctor.  Keep all follow-up visits as told by your doctor. This is important. How is this prevented? To lower your risk of getting this condition again:  Wash your hands often with soap and water. If you cannot use soap and water, use  hand sanitizer.  Avoid contact with people who have cold symptoms.  Try not to touch your hands to your mouth, nose, or eyes.  Make sure to get the flu shot every year.  Contact a doctor if:  Your symptoms do not get better in 2 weeks. Get help right away if:  You cough up blood.  You have chest pain.  You have very bad shortness of breath.  You become dehydrated.  You faint (pass out) or keep feeling like you are going to pass out.  You keep throwing up (vomiting).  You have a very bad headache.  Your fever or chills gets worse. This information is not intended to replace advice given to you by your health care provider. Make sure you discuss any questions you have with your health care provider. Document Released: 08/14/2007 Document Revised: 10/04/2015 Document Reviewed: 08/16/2015 Elsevier Interactive Patient Education  [employer].

## 2018-01-26 ENCOUNTER — Telehealth: Payer: Self-pay | Admitting: Emergency Medicine

## 2018-01-26 MED ORDER — FLUCONAZOLE 150 MG PO TABS: 150.0000 mg | ORAL_TABLET | Freq: Every day | ORAL | 0 refills | Status: AC

## 2018-01-26 NOTE — Addendum Note (Signed)
Addended by: Darlin Priestly on: 01/26/2018 11:13 AM   Modules accepted: Orders

## 2018-01-26 NOTE — Telephone Encounter (Signed)
Patient was informed that medication we sent to [facility])

## 2018-01-26 NOTE — Telephone Encounter (Signed)
Ok. Will seen Diflucan to pharmacy for patient. Thank you. SFS PA-C.

## 2018-07-09 MED FILL — LISINOPRIL 10 MG TABLET: 10 | 90 days supply | Qty: 90 | Fill #0

## 2018-07-09 MED FILL — metFORMIN HCL 1000 MG TABS: 1000 | 90 days supply | Qty: 180 | Fill #0

## 2018-10-05 MED FILL — LISINOPRIL 10 MG TABS: 10 | 90 days supply | Qty: 90 | Fill #0

## 2018-10-05 MED FILL — metFORMIN HCL 1000 MG TABS: 1000 | 90 days supply | Qty: 180 | Fill #0

## 2018-10-22 DIAGNOSIS — I10 Essential (primary) hypertension: Secondary | ICD-10-CM | POA: Diagnosis not present

## 2019-04-23 ENCOUNTER — Ambulatory Visit: Payer: 59 | Attending: Internal Medicine

## 2019-07-12 DIAGNOSIS — E1165 Type 2 diabetes mellitus with hyperglycemia: Secondary | ICD-10-CM | POA: Diagnosis not present

## 2019-07-12 DIAGNOSIS — Z0000 Encounter for general adult medical examination without abnormal findings: Secondary | ICD-10-CM | POA: Diagnosis not present

## 2019-07-15 ENCOUNTER — Other Ambulatory Visit: Payer: Self-pay | Admitting: Family Medicine

## 2019-07-15 DIAGNOSIS — E1165 Type 2 diabetes mellitus with hyperglycemia: Secondary | ICD-10-CM | POA: Diagnosis not present

## 2019-07-15 DIAGNOSIS — G4700 Insomnia, unspecified: Secondary | ICD-10-CM | POA: Diagnosis not present

## 2019-07-15 DIAGNOSIS — I10 Essential (primary) hypertension: Secondary | ICD-10-CM | POA: Diagnosis not present

## 2019-07-15 DIAGNOSIS — Z0000 Encounter for general adult medical examination without abnormal findings: Secondary | ICD-10-CM | POA: Diagnosis not present

## 2019-09-17 DIAGNOSIS — J329 Chronic sinusitis, unspecified: Secondary | ICD-10-CM | POA: Diagnosis not present

## 2019-09-17 DIAGNOSIS — B9689 Other specified bacterial agents as the cause of diseases classified elsewhere: Secondary | ICD-10-CM | POA: Diagnosis not present

## 2019-09-17 DIAGNOSIS — Z03818 Encounter for observation for suspected exposure to other biological agents ruled out: Secondary | ICD-10-CM | POA: Diagnosis not present

## 2019-10-05 ENCOUNTER — Other Ambulatory Visit: Payer: Self-pay | Admitting: Family Medicine

## 2019-12-03 DIAGNOSIS — Z03818 Encounter for observation for suspected exposure to other biological agents ruled out: Secondary | ICD-10-CM | POA: Diagnosis not present

## 2019-12-03 DIAGNOSIS — J0100 Acute maxillary sinusitis, unspecified: Secondary | ICD-10-CM | POA: Diagnosis not present

## 2019-12-03 DIAGNOSIS — H1032 Unspecified acute conjunctivitis, left eye: Secondary | ICD-10-CM | POA: Diagnosis not present

## 2019-12-27 ENCOUNTER — Other Ambulatory Visit: Payer: Self-pay | Admitting: Family Medicine

## 2019-12-27 DIAGNOSIS — Z1231 Encounter for screening mammogram for malignant neoplasm of breast: Secondary | ICD-10-CM

## 2019-12-31 ENCOUNTER — Other Ambulatory Visit: Payer: Self-pay | Admitting: Internal Medicine

## 2019-12-31 ENCOUNTER — Other Ambulatory Visit: Payer: Self-pay

## 2019-12-31 ENCOUNTER — Ambulatory Visit: Payer: 59 | Attending: Internal Medicine

## 2019-12-31 ENCOUNTER — Ambulatory Visit
Admission: RE | Admit: 2019-12-31 | Discharge: 2019-12-31 | Disposition: A | Discharge status: Home / Self Care | Payer: 59 | Source: Ambulatory Visit | Attending: Family Medicine | Admitting: Family Medicine

## 2019-12-31 DIAGNOSIS — Z1231 Encounter for screening mammogram for malignant neoplasm of breast: Secondary | ICD-10-CM | POA: Insufficient documentation

## 2019-12-31 DIAGNOSIS — Z23 Encounter for immunization: Secondary | ICD-10-CM

## 2019-12-31 HISTORY — DX: Malignant (primary) neoplasm, unspecified: C80.1

## 2019-12-31 NOTE — Progress Notes (Signed)
   Covid-19 Vaccination Clinic  Name:  Carolyn Levine    MRN: [id number] DOB: Jun 24, 1960  12/31/2019  [name] was observed post Covid-19 immunization for 15 minutes without incident. She was provided with Vaccine Information Sheet and instruction to access the V-Safe system.   [name] was instructed to call 911 with any severe reactions post vaccine: Marland Kitchen Difficulty breathing  . Swelling of face and throat  . A fast heartbeat  . A bad rash all over body  . Dizziness and weakness

## 2020-01-21 DIAGNOSIS — E1165 Type 2 diabetes mellitus with hyperglycemia: Secondary | ICD-10-CM | POA: Diagnosis not present

## 2020-01-25 DIAGNOSIS — E119 Type 2 diabetes mellitus without complications: Secondary | ICD-10-CM | POA: Diagnosis not present

## 2020-01-31 ENCOUNTER — Other Ambulatory Visit: Payer: Self-pay | Admitting: Family Medicine

## 2020-02-22 ENCOUNTER — Ambulatory Visit: Payer: 59

## 2020-04-28 DIAGNOSIS — E119 Type 2 diabetes mellitus without complications: Secondary | ICD-10-CM | POA: Diagnosis not present

## 2020-04-28 DIAGNOSIS — H40053 Ocular hypertension, bilateral: Secondary | ICD-10-CM | POA: Diagnosis not present

## 2020-07-07 ENCOUNTER — Other Ambulatory Visit: Payer: Self-pay

## 2020-07-07 ENCOUNTER — Ambulatory Visit: Payer: 59 | Attending: Internal Medicine

## 2020-07-07 DIAGNOSIS — Z23 Encounter for immunization: Secondary | ICD-10-CM

## 2020-07-07 MED ORDER — PFIZER-BIONT COVID-19 VAC-TRIS 30 MCG/0.3ML IM SUSP
INTRAMUSCULAR | 0 refills | Status: DC
  Filled 2020-07-07: qty 0.3, 17d supply, fill #0

## 2020-07-07 NOTE — Progress Notes (Signed)
   Covid-19 Vaccination Clinic  Name:  Carolyn Levine    MRN: [id number] DOB: 1961/01/08  07/07/2020  [name] was observed post Covid-19 immunization for 15 minutes without incident. She was provided with Vaccine Information Sheet and instruction to access the V-Safe system.   [name] was instructed to call 911 with any severe reactions post vaccine: Marland Kitchen Difficulty breathing  . Swelling of face and throat  . A fast heartbeat  . A bad rash all over body  . Dizziness and weakness   Immunizations Administered    Name Date Dose VIS Date Route   PFIZER Comrnaty(Gray TOP) Covid-19 Vaccine 07/07/2020  2:36 PM 0.3 mL 02/17/2020 Intramuscular   Manufacturer: [place]   Lot: [id number]   NDC: [phone]

## 2020-07-10 ENCOUNTER — Other Ambulatory Visit: Payer: Self-pay

## 2020-07-10 MED FILL — Pioglitazone HCl Tab 30 MG (Base Equiv): ORAL | 90 days supply | Qty: 90 | Fill #0 | Status: AC

## 2020-07-10 MED FILL — Lisinopril Tab 10 MG: ORAL | 90 days supply | Qty: 90 | Fill #0 | Status: AC

## 2020-07-14 ENCOUNTER — Other Ambulatory Visit: Payer: Self-pay

## 2020-07-14 MED ORDER — TRAZODONE HCL 50 MG PO TABS
ORAL_TABLET | ORAL | 0 refills | Status: DC
  Filled 2020-07-14: qty 90, 90d supply, fill #0

## 2020-07-14 MED FILL — Metformin HCl Tab 1000 MG: ORAL | 90 days supply | Qty: 180 | Fill #0 | Status: AC

## 2020-07-26 ENCOUNTER — Other Ambulatory Visit: Payer: Self-pay

## 2020-08-01 ENCOUNTER — Other Ambulatory Visit: Payer: Self-pay

## 2020-08-17 ENCOUNTER — Other Ambulatory Visit: Payer: Self-pay

## 2020-08-17 MED ORDER — ZOSTER VAC RECOMB ADJUVANTED 50 MCG/0.5ML IM SUSR
0.5000 mL | Freq: Once | INTRAMUSCULAR | 1 refills | Status: AC
  Filled 2020-08-17: qty 0.5, 1d supply, fill #0
  Filled 2020-10-27: qty 0.5, 1d supply, fill #1

## 2020-09-18 ENCOUNTER — Other Ambulatory Visit (HOSPITAL_COMMUNITY): Payer: Self-pay

## 2020-10-04 ENCOUNTER — Other Ambulatory Visit: Payer: Self-pay

## 2020-10-04 MED FILL — Lisinopril Tab 10 MG: ORAL | 90 days supply | Qty: 90 | Fill #1 | Status: AC

## 2020-10-05 ENCOUNTER — Other Ambulatory Visit: Payer: Self-pay

## 2020-10-05 MED ORDER — PIOGLITAZONE HCL 30 MG PO TABS
ORAL_TABLET | Freq: Every day | ORAL | 3 refills | Status: DC
  Filled 2020-10-05: qty 90, 90d supply, fill #0
  Filled 2021-01-11: qty 90, 90d supply, fill #1
  Filled 2021-04-09: qty 90, 90d supply, fill #2
  Filled 2021-07-09: qty 90, 90d supply, fill #3

## 2020-10-22 ENCOUNTER — Other Ambulatory Visit: Payer: Self-pay

## 2020-10-23 ENCOUNTER — Other Ambulatory Visit: Payer: Self-pay

## 2020-10-24 ENCOUNTER — Other Ambulatory Visit: Payer: Self-pay

## 2020-10-24 MED ORDER — TRAZODONE HCL 50 MG PO TABS
50.0000 mg | ORAL_TABLET | Freq: Every day | ORAL | 0 refills | Status: DC
  Filled 2020-10-24: qty 90, 90d supply, fill #0

## 2020-10-27 ENCOUNTER — Other Ambulatory Visit: Payer: Self-pay

## 2020-10-30 ENCOUNTER — Other Ambulatory Visit: Payer: Self-pay

## 2020-11-01 ENCOUNTER — Other Ambulatory Visit: Payer: Self-pay

## 2020-11-02 ENCOUNTER — Other Ambulatory Visit: Payer: Self-pay

## 2020-11-03 ENCOUNTER — Other Ambulatory Visit: Payer: Self-pay

## 2020-11-11 IMAGING — MG DIGITAL SCREENING BILAT W/ TOMO W/ CAD
8 series · 8 of 24 positions shown · non-contrast
Comparison: Previous exam(s).

ACR Breast Density Category a: The breast tissue is almost entirely
fatty.

CLINICAL DATA: Screening.

EXAM:
DIGITAL SCREENING BILATERAL MAMMOGRAM WITH TOMO AND CAD

[R MLO synth-2D]
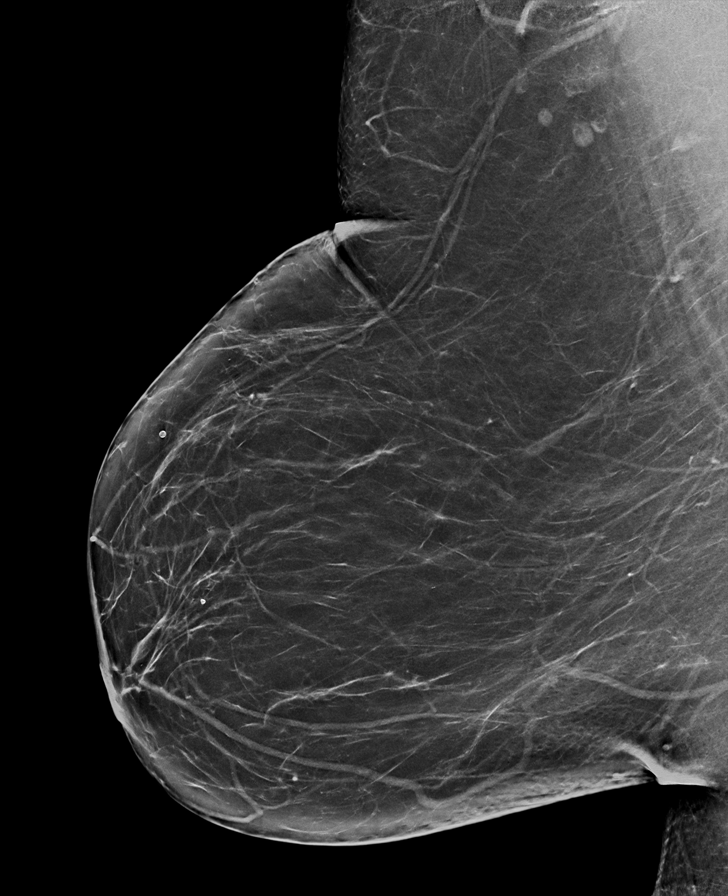

[L CC synth-2D]
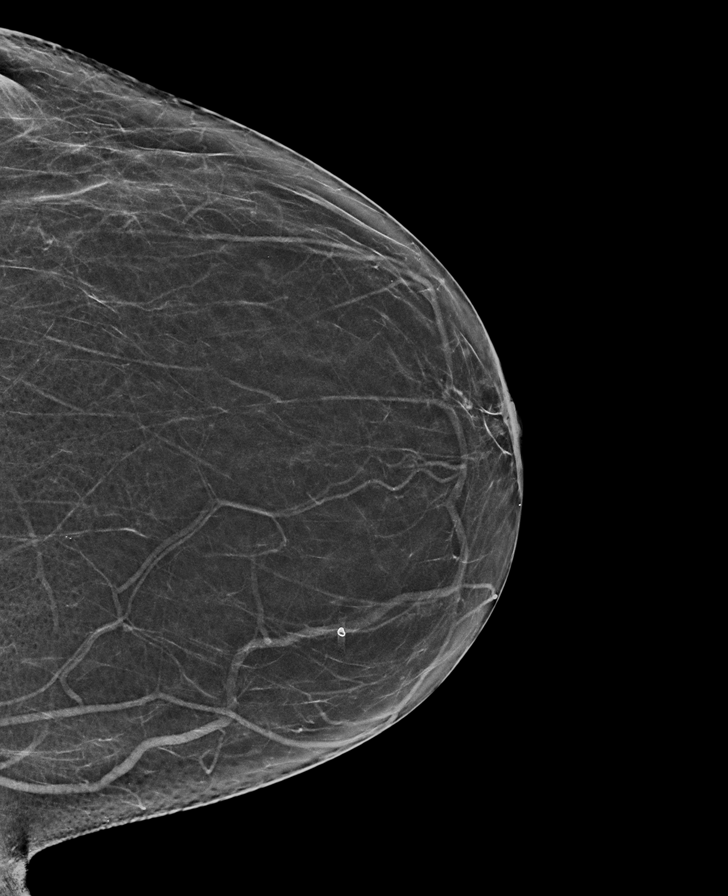

[R CC synth-2D]
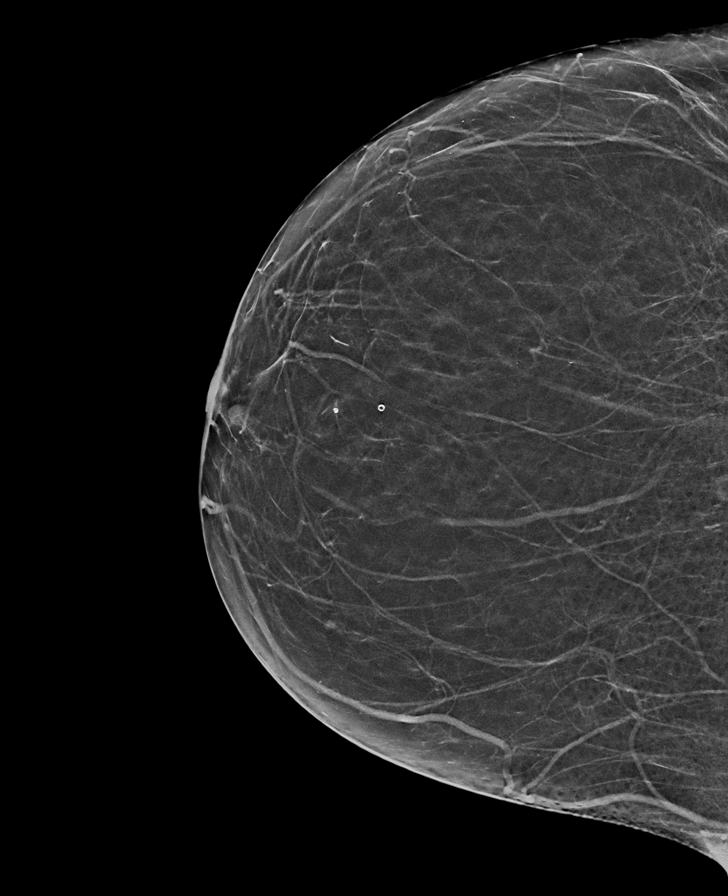

[L MLO synth-2D]
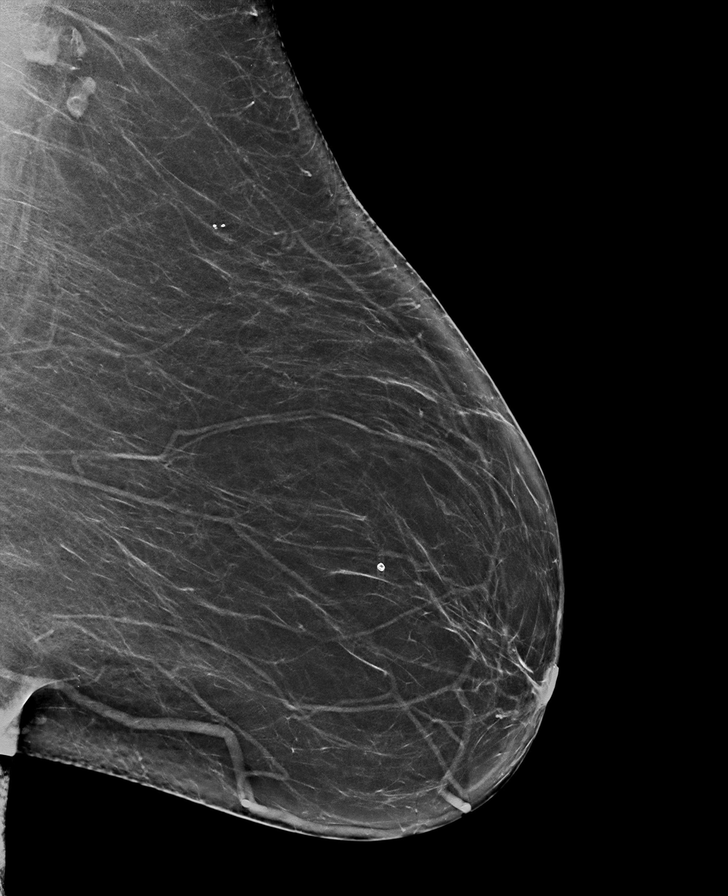

[L MLO tomo · tomo slice 39/76.0]
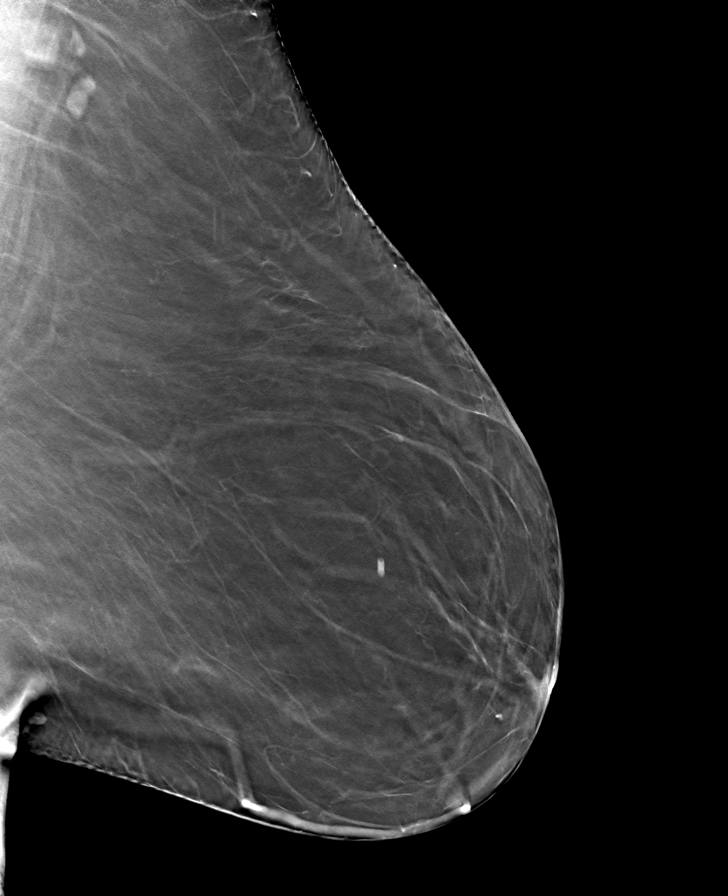

[L CC tomo · tomo slice 31/61.0]
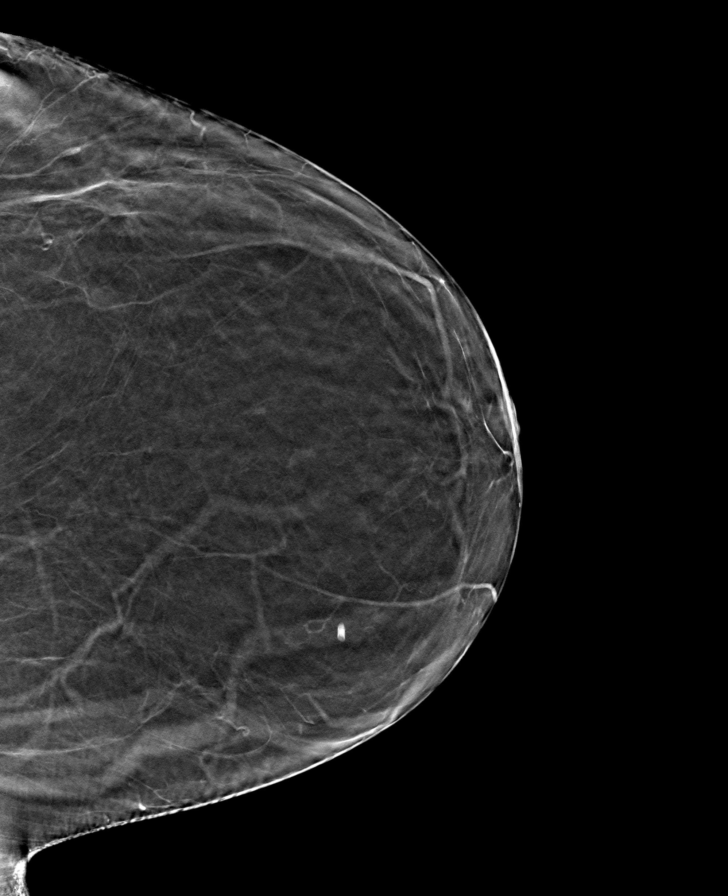

[R CC tomo · tomo slice 29/57.0]
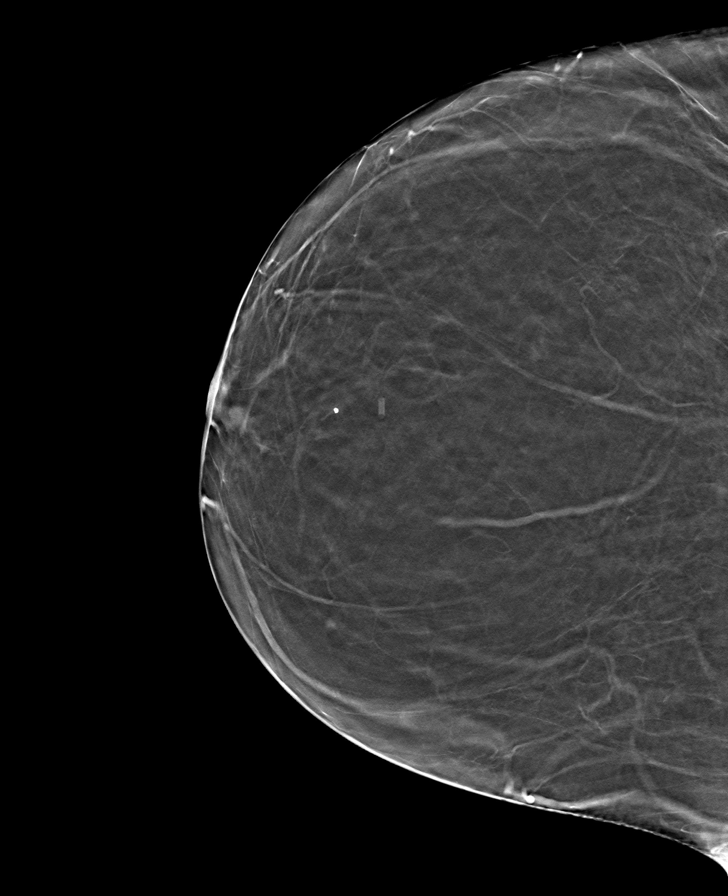

[R MLO tomo · tomo slice 43/85.0]
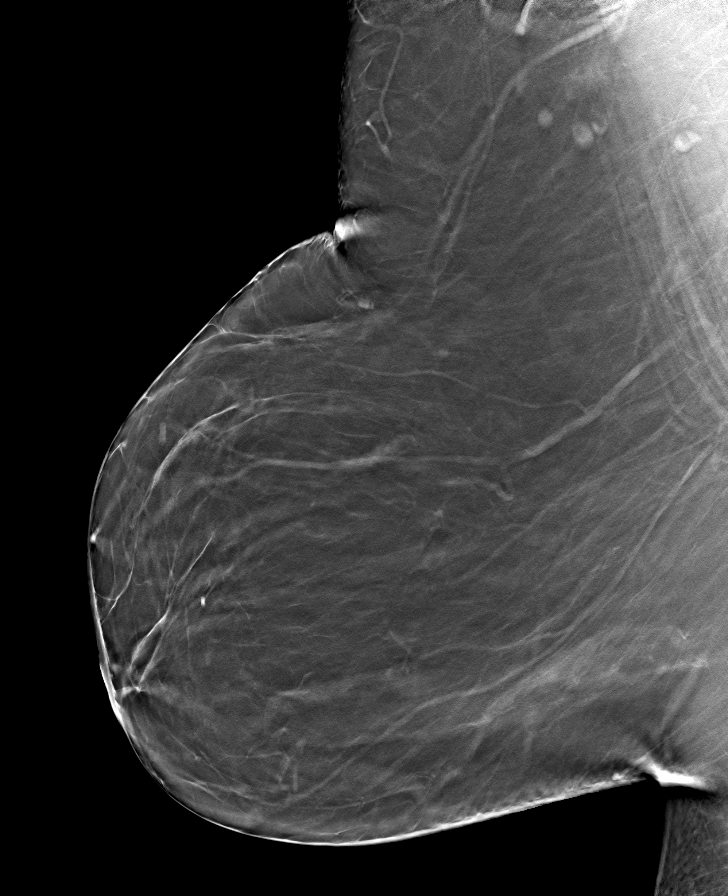

[8 of 24 positions shown; findings below may reference images not displayed]

FINDINGS: There are no findings suspicious for malignancy. Images were
processed with CAD.
IMPRESSION: No mammographic evidence of malignancy. A result letter of this
screening mammogram will be mailed directly to the patient.

RECOMMENDATION:
Screening mammogram in one year. (Code:[id number]-[id number])

BI-RADS CATEGORY  1: Negative.

## 2020-11-13 MED FILL — Metformin HCl Tab 1000 MG: ORAL | 90 days supply | Qty: 180 | Fill #1 | Status: AC

## 2020-11-14 ENCOUNTER — Other Ambulatory Visit: Payer: Self-pay

## 2020-11-17 ENCOUNTER — Telehealth: Payer: 59 | Admitting: Physician Assistant

## 2020-11-17 ENCOUNTER — Other Ambulatory Visit: Payer: Self-pay

## 2020-11-17 DIAGNOSIS — J019 Acute sinusitis, unspecified: Secondary | ICD-10-CM | POA: Diagnosis not present

## 2020-11-17 DIAGNOSIS — B379 Candidiasis, unspecified: Secondary | ICD-10-CM

## 2020-11-17 DIAGNOSIS — B9689 Other specified bacterial agents as the cause of diseases classified elsewhere: Secondary | ICD-10-CM

## 2020-11-17 DIAGNOSIS — T3695XA Adverse effect of unspecified systemic antibiotic, initial encounter: Secondary | ICD-10-CM

## 2020-11-17 MED ORDER — FLUCONAZOLE 150 MG PO TABS
150.0000 mg | ORAL_TABLET | Freq: Once | ORAL | 0 refills | Status: AC
  Filled 2020-11-17: qty 1, 1d supply, fill #0

## 2020-11-17 MED ORDER — AMOXICILLIN-POT CLAVULANATE 875-125 MG PO TABS
1.0000 | ORAL_TABLET | Freq: Two times a day (BID) | ORAL | 0 refills | Status: DC
Start: 2020-11-17 — End: 2021-03-21
  Filled 2020-11-17: qty 20, 10d supply, fill #0

## 2020-11-17 NOTE — Patient Instructions (Signed)
Georgiann Cocker, thank you for joining Mar Daring, PA-C for today's virtual visit.  While this provider is not your primary care provider (PCP), if your PCP is located in our provider database this encounter information will be shared with them immediately following your visit.  Consent: (Patient) Carolyn Levine provided verbal consent for this virtual visit at the beginning of the encounter.  Current Medications:  Current Outpatient Medications:    amoxicillin-clavulanate (AUGMENTIN) 875-125 MG tablet, Take 1 tablet by mouth 2 (two) times daily., Disp: 20 tablet, Rfl: 0   fluconazole (DIFLUCAN) 150 MG tablet, Take 1 tablet (150 mg total) by mouth once for 1 dose., Disp: 1 tablet, Rfl: 0   albuterol (PROVENTIL HFA;VENTOLIN HFA) 108 (90 Base) MCG/ACT inhaler, Inhale 2 puffs into the lungs every 4 (four) hours as needed for wheezing or shortness of breath., Disp: 1 Inhaler, Rfl: 0   COVID-19 mRNA Vac-TriS, Pfizer, (PFIZER-BIONT COVID-19 VAC-TRIS) SUSP injection, Inject into the muscle., Disp: 0.3 mL, Rfl: 0   COVID-19 mRNA vaccine, Pfizer, 30 MCG/0.3ML injection, USE AS DIRECTED, Disp: .3 mL, Rfl: 0   guaiFENesin-dextromethorphan (ROBITUSSIN DM) 100-10 MG/5ML syrup, Take 5 mLs by mouth every 4 (four) hours as needed for cough., Disp: 118 mL, Rfl: 0   lisinopril (PRINIVIL,ZESTRIL) 10 MG tablet, Take 10 mg by mouth daily., Disp: , Rfl:    lisinopril (ZESTRIL) 10 MG tablet, TAKE 1 TABLET BY MOUTH ONCE DAILY, Disp: 90 tablet, Rfl: 3   metFORMIN (GLUCOPHAGE) 1000 MG tablet, Take 1,000 mg by mouth 2 (two) times daily with a meal., Disp: , Rfl:    metFORMIN (GLUCOPHAGE) 1000 MG tablet, TAKE ONE TABLET BY MOUTH 2 TIMES A DAY, Disp: 180 tablet, Rfl: 3   pioglitazone (ACTOS) 30 MG tablet, TAKE 1 TABLET BY MOUTH ONCE DAILY, Disp: 90 tablet, Rfl: 3   predniSONE (DELTASONE) 10 MG tablet, Take 5 tabs po qd x 2 days, then 4 tabs po qd x 2 days, then 3 tabs po qd x 2 days, then 2 tabs po qd x 2 days,  then 1 tab po qd x 2 days, Disp: 30 tablet, Rfl: 0   rosuvastatin (CRESTOR) 5 MG tablet, Take 5 mg by mouth daily. Takes every three days, Disp: , Rfl:    traZODone (DESYREL) 50 MG tablet, TAKE 1 TABLET BY MOUTH NIGHTLY, Disp: 90 tablet, Rfl: 3   traZODone (DESYREL) 50 MG tablet, Take 1 tablet (50 mg total) by mouth nightly, Disp: 90 tablet, Rfl: 0   traZODone (DESYREL) 50 MG tablet, Take 1 tablet (50 mg total) by mouth at bedtime, Disp: 90 tablet, Rfl: 0   Medications ordered in this encounter:  Meds ordered this encounter  Medications   amoxicillin-clavulanate (AUGMENTIN) 875-125 MG tablet    Sig: Take 1 tablet by mouth 2 (two) times daily.    Dispense:  20 tablet    Refill:  0    Order Specific Question:   Supervising Provider    Answer:   MILLER, BRIAN [[id number]]   fluconazole (DIFLUCAN) 150 MG tablet    Sig: Take 1 tablet (150 mg total) by mouth once for 1 dose.    Dispense:  1 tablet    Refill:  0    Order Specific Question:   Supervising Provider    Answer:   Sabra Heck, BRIAN [[id number]]     *If you need refills on other medications prior to your next appointment, please contact your pharmacy*  Follow-Up: Call back or seek an in-person evaluation  if the symptoms worsen or if the condition fails to improve as anticipated.  Other Instructions Sinusitis, Adult Sinusitis is soreness and swelling (inflammation) of your sinuses. Sinuses are hollow spaces in the bones around your face. They are located: Around your eyes. In the middle of your forehead. Behind your nose. In your cheekbones. Your sinuses and nasal passages are lined with a fluid called mucus. Mucus drains out of your sinuses. Swelling can trap mucus in your sinuses. This lets germs (bacteria, virus, or fungus) grow, which leads to infection. Most of the time, this condition is caused by a virus. What are the causes? This condition is caused by: Allergies. Asthma. Germs. Things that block your nose or sinuses. Growths in  the nose (nasal polyps). Chemicals or irritants in the air. Fungus (rare). What increases the risk? You are more likely to develop this condition if: You have a weak body defense system (immune system). You do a lot of swimming or diving. You use nasal sprays too much. You smoke. What are the signs or symptoms? The main symptoms of this condition are pain and a feeling of pressure around the sinuses. Other symptoms include: Stuffy nose (congestion). Runny nose (drainage). Swelling and warmth in the sinuses. Headache. Toothache. A cough that may get worse at night. Mucus that collects in the throat or the back of the nose (postnasal drip). Being unable to smell and taste. Being very tired (fatigue). A fever. Sore throat. Bad breath. How is this diagnosed? This condition is diagnosed based on: Your symptoms. Your medical history. A physical exam. Tests to find out if your condition is short-term (acute) or long-term (chronic). Your doctor may: Check your nose for growths (polyps). Check your sinuses using a tool that has a light (endoscope). Check for allergies or germs. Do imaging tests, such as an MRI or CT scan. How is this treated? Treatment for this condition depends on the cause and whether it is short-term or long-term. If caused by a virus, your symptoms should go away on their own within 10 days. You may be given medicines to relieve symptoms. They include: Medicines that shrink swollen tissue in the nose. Medicines that treat allergies (antihistamines). A spray that treats swelling of the nostrils.  Rinses that help get rid of thick mucus in your nose (nasal saline washes). If caused by bacteria, your doctor may wait to see if you will get better without treatment. You may be given antibiotic medicine if you have: A very bad infection. A weak body defense system. If caused by growths in the nose, you may need to have surgery. Follow these instructions at  home: Medicines Take, use, or apply over-the-counter and prescription medicines only as told by your doctor. These may include nasal sprays. If you were prescribed an antibiotic medicine, take it as told by your doctor. Do not stop taking the antibiotic even if you start to feel better. Hydrate and humidify  Drink enough water to keep your pee (urine) pale yellow. Use a cool mist humidifier to keep the humidity level in your home above 50%. Breathe in steam for 10-15 minutes, 3-4 times a day, or as told by your doctor. You can do this in the bathroom while a hot shower is running. Try not to spend time in cool or dry air. Rest Rest as much as you can. Sleep with your head raised (elevated). Make sure you get enough sleep each night. General instructions  Put a warm, moist washcloth on your face  3-4 times a day, or as often as told by your doctor. This will help with discomfort. Wash your hands often with soap and water. If there is no soap and water, use hand sanitizer. Do not smoke. Avoid being around people who are smoking (secondhand smoke). Keep all follow-up visits as told by your doctor. This is important. Contact a doctor if: You have a fever. Your symptoms get worse. Your symptoms do not get better within 10 days. Get help right away if: You have a very bad headache. You cannot stop throwing up (vomiting). You have very bad pain or swelling around your face or eyes. You have trouble seeing. You feel confused. Your neck is stiff. You have trouble breathing. Summary Sinusitis is swelling of your sinuses. Sinuses are hollow spaces in the bones around your face. This condition is caused by tissues in your nose that become inflamed or swollen. This traps germs. These can lead to infection. If you were prescribed an antibiotic medicine, take it as told by your doctor. Do not stop taking it even if you start to feel better. Keep all follow-up visits as told by your doctor. This  is important. This information is not intended to replace advice given to you by your health care provider. Make sure you discuss any questions you have with your health care provider. Document Revised: 07/28/2017 Document Reviewed: 07/28/2017 Elsevier Patient Education  2022 [employer].    If you have been instructed to have an in-person evaluation today at a local Urgent Care facility, please use the link below. It will take you to a list of all of our available [facility], including address, phone number and hours of operation. Please do not delay care.  [facility]  If you or a family member do not have a primary care provider, use the link below to schedule a visit and establish care. When you choose a [facility] primary care physician or advanced practice provider, you gain a long-term partner in health. Find a Primary Care Provider  Learn more about [place] [facility]'s in-office and virtual care options: [facility]

## 2020-11-17 NOTE — Progress Notes (Signed)
Virtual Visit Consent   Carolyn Levine, you are scheduled for a virtual visit with a [facility] provider today.     Just as with appointments in the office, your consent must be obtained to participate.  Your consent will be active for this visit and any virtual visit you may have with one of our providers in the next 365 days.     If you have a MyChart account, a copy of this consent can be sent to you electronically.  All virtual visits are billed to your insurance company just like a traditional visit in the office.    As this is a virtual visit, video technology does not allow for your provider to perform a traditional examination.  This may limit your provider's ability to fully assess your condition.  If your provider identifies any concerns that need to be evaluated in person or the need to arrange testing (such as labs, EKG, etc.), we will make arrangements to do so.     Although advances in technology are sophisticated, we cannot ensure that it will always work on either your end or our end.  If the connection with a video visit is poor, the visit may have to be switched to a telephone visit.  With either a video or telephone visit, we are not always able to ensure that we have a secure connection.     I need to obtain your verbal consent now.   Are you willing to proceed with your visit today?    LANAI LEFFERS has provided verbal consent on 11/17/2020 for a virtual visit (video or telephone).   Mar Daring, PA-C   Date: 11/17/2020 8:24 AM   Virtual Visit via Video Note   I, Mar Daring, connected with  Carolyn Levine  ([id number], Aug 26, 1960) on 11/17/20 at  8:15 AM EDT by a video-enabled telemedicine application and verified that I am speaking with the correct person using two identifiers.  Location: Patient: Virtual Visit Location Patient: Home Provider: Virtual Visit Location Provider: Home Office   I discussed the limitations of evaluation and management by  telemedicine and the availability of in person appointments. The patient expressed understanding and agreed to proceed.    History of Present Illness: Carolyn Levine is a 60 y.o. who identifies as a female who was assigned female at birth, and is being seen today for possible sinus infection.  HPI: Sinusitis This is a new problem. The current episode started in the past 7 days (4-5 days ago). The problem has been gradually worsening since onset. Maximum temperature: felt feverish last night but did not check. Associated symptoms include chills, congestion, coughing, headaches, sinus pressure and a sore throat. Pertinent negatives include no ear pain or hoarse voice. (Rhinorrhea, post nasal drainage) Treatments tried: dayquil. The treatment provided mild relief.    Covid 19 testing negative  Problems:  Patient Active Problem List   Diagnosis Date Noted   Hyperlipidemia, unspecified 06/04/2016   Hypertension 06/04/2016   Obesity 06/04/2016   Anemia 06/04/2016   Type 2 diabetes mellitus ([place]) 03/08/2015    Allergies:  Allergies  Allergen Reactions   Codeine Sulfate    Simvastatin     Other reaction(s): Muscle Pain   Sulphadimidine [Sulfamethazine]    Medications:  Current Outpatient Medications:    amoxicillin-clavulanate (AUGMENTIN) 875-125 MG tablet, Take 1 tablet by mouth 2 (two) times daily., Disp: 20 tablet, Rfl: 0   fluconazole (DIFLUCAN) 150 MG tablet, Take 1 tablet (  150 mg total) by mouth once for 1 dose., Disp: 1 tablet, Rfl: 0   albuterol (PROVENTIL HFA;VENTOLIN HFA) 108 (90 Base) MCG/ACT inhaler, Inhale 2 puffs into the lungs every 4 (four) hours as needed for wheezing or shortness of breath., Disp: 1 Inhaler, Rfl: 0   COVID-19 mRNA Vac-TriS, Pfizer, (PFIZER-BIONT COVID-19 VAC-TRIS) SUSP injection, Inject into the muscle., Disp: 0.3 mL, Rfl: 0   COVID-19 mRNA vaccine, Pfizer, 30 MCG/0.3ML injection, USE AS DIRECTED, Disp: .3 mL, Rfl: 0   guaiFENesin-dextromethorphan  (ROBITUSSIN DM) 100-10 MG/5ML syrup, Take 5 mLs by mouth every 4 (four) hours as needed for cough., Disp: 118 mL, Rfl: 0   lisinopril (PRINIVIL,ZESTRIL) 10 MG tablet, Take 10 mg by mouth daily., Disp: , Rfl:    lisinopril (ZESTRIL) 10 MG tablet, TAKE 1 TABLET BY MOUTH ONCE DAILY, Disp: 90 tablet, Rfl: 3   metFORMIN (GLUCOPHAGE) 1000 MG tablet, Take 1,000 mg by mouth 2 (two) times daily with a meal., Disp: , Rfl:    metFORMIN (GLUCOPHAGE) 1000 MG tablet, TAKE ONE TABLET BY MOUTH 2 TIMES A DAY, Disp: 180 tablet, Rfl: 3   pioglitazone (ACTOS) 30 MG tablet, TAKE 1 TABLET BY MOUTH ONCE DAILY, Disp: 90 tablet, Rfl: 3   predniSONE (DELTASONE) 10 MG tablet, Take 5 tabs po qd x 2 days, then 4 tabs po qd x 2 days, then 3 tabs po qd x 2 days, then 2 tabs po qd x 2 days, then 1 tab po qd x 2 days, Disp: 30 tablet, Rfl: 0   rosuvastatin (CRESTOR) 5 MG tablet, Take 5 mg by mouth daily. Takes every three days, Disp: , Rfl:    traZODone (DESYREL) 50 MG tablet, TAKE 1 TABLET BY MOUTH NIGHTLY, Disp: 90 tablet, Rfl: 3   traZODone (DESYREL) 50 MG tablet, Take 1 tablet (50 mg total) by mouth nightly, Disp: 90 tablet, Rfl: 0   traZODone (DESYREL) 50 MG tablet, Take 1 tablet (50 mg total) by mouth at bedtime, Disp: 90 tablet, Rfl: 0  Observations/Objective: Patient is well-developed, well-nourished in no acute distress.  Resting comfortably at home.  Head is normocephalic, atraumatic.  No labored breathing.  Speech is clear and coherent with logical content.  Patient is alert and oriented at baseline.    Assessment and Plan: 1. Acute bacterial sinusitis - amoxicillin-clavulanate (AUGMENTIN) 875-125 MG tablet; Take 1 tablet by mouth 2 (two) times daily.  Dispense: 20 tablet; Refill: 0  2. Antibiotic-induced yeast infection - fluconazole (DIFLUCAN) 150 MG tablet; Take 1 tablet (150 mg total) by mouth once for 1 dose.  Dispense: 1 tablet; Refill: 0  - Worsening symptoms that have not responded to OTC medications.   - Will give augmentin - Continue allergy medications.  - Stay well hydrated and get plenty of rest.  - Gets yeast infections with antibiotics. Diflucan given  - Call if no symptom improvement or if symptoms worsen.  Follow Up Instructions: I discussed the assessment and treatment plan with the patient. The patient was provided an opportunity to ask questions and all were answered. The patient agreed with the plan and demonstrated an understanding of the instructions.  A copy of instructions were sent to the patient via MyChart.  The patient was advised to call back or seek an in-person evaluation if the symptoms worsen or if the condition fails to improve as anticipated.  Time:  I spent 11 minutes with the patient via telehealth technology discussing the above problems/concerns.    Mar Daring, PA-C

## 2020-12-06 DIAGNOSIS — E119 Type 2 diabetes mellitus without complications: Secondary | ICD-10-CM | POA: Diagnosis not present

## 2020-12-12 DIAGNOSIS — I10 Essential (primary) hypertension: Secondary | ICD-10-CM | POA: Diagnosis not present

## 2021-01-02 ENCOUNTER — Other Ambulatory Visit: Payer: Self-pay

## 2021-01-02 MED ORDER — INFLUENZA VAC SPLIT QUAD 0.5 ML IM SUSY
PREFILLED_SYRINGE | INTRAMUSCULAR | 0 refills | Status: DC
  Filled 2021-01-02: qty 0.5, 30d supply, fill #0
  Filled 2021-01-05: qty 0.5, 1d supply, fill #0

## 2021-01-05 ENCOUNTER — Other Ambulatory Visit: Payer: Self-pay

## 2021-01-11 ENCOUNTER — Other Ambulatory Visit: Payer: Self-pay

## 2021-01-12 ENCOUNTER — Other Ambulatory Visit: Payer: Self-pay

## 2021-01-12 MED ORDER — LISINOPRIL 10 MG PO TABS
ORAL_TABLET | Freq: Every day | ORAL | 3 refills | Status: DC
Start: 2021-01-12 — End: 2021-11-02
  Filled 2021-01-12: qty 90, 90d supply, fill #0
  Filled 2021-04-09: qty 90, 90d supply, fill #1

## 2021-01-13 DIAGNOSIS — J0100 Acute maxillary sinusitis, unspecified: Secondary | ICD-10-CM | POA: Diagnosis not present

## 2021-01-13 DIAGNOSIS — Z03818 Encounter for observation for suspected exposure to other biological agents ruled out: Secondary | ICD-10-CM | POA: Diagnosis not present

## 2021-01-28 ENCOUNTER — Other Ambulatory Visit: Payer: Self-pay

## 2021-01-29 ENCOUNTER — Other Ambulatory Visit: Payer: Self-pay

## 2021-01-29 MED ORDER — TRAZODONE HCL 50 MG PO TABS
50.0000 mg | ORAL_TABLET | Freq: Every day | ORAL | 3 refills | Status: DC
  Filled 2021-01-29: qty 90, 90d supply, fill #0
  Filled 2021-05-10: qty 90, 90d supply, fill #1
  Filled 2021-08-15: qty 90, 90d supply, fill #2
  Filled 2021-12-13: qty 90, 90d supply, fill #3

## 2021-02-09 ENCOUNTER — Other Ambulatory Visit: Payer: Self-pay

## 2021-02-09 MED ORDER — METFORMIN HCL 1000 MG PO TABS
1000.0000 mg | ORAL_TABLET | Freq: Two times a day (BID) | ORAL | 3 refills | Status: DC
  Filled 2021-02-09: qty 180, 90d supply, fill #0
  Filled 2021-05-23: qty 180, 90d supply, fill #1
  Filled 2021-08-15: qty 180, 90d supply, fill #2
  Filled 2021-11-16: qty 180, 90d supply, fill #3

## 2021-03-09 DIAGNOSIS — J0100 Acute maxillary sinusitis, unspecified: Secondary | ICD-10-CM | POA: Insufficient documentation

## 2021-03-21 ENCOUNTER — Other Ambulatory Visit: Payer: Self-pay

## 2021-03-21 ENCOUNTER — Telehealth: Payer: 59 | Admitting: Nurse Practitioner

## 2021-03-21 DIAGNOSIS — J019 Acute sinusitis, unspecified: Secondary | ICD-10-CM | POA: Diagnosis not present

## 2021-03-21 DIAGNOSIS — B9689 Other specified bacterial agents as the cause of diseases classified elsewhere: Secondary | ICD-10-CM

## 2021-03-21 MED ORDER — FLUCONAZOLE 150 MG PO TABS
150.0000 mg | ORAL_TABLET | Freq: Once | ORAL | 0 refills | Status: AC
  Filled 2021-03-21: qty 1, 1d supply, fill #0

## 2021-03-21 MED ORDER — AMOXICILLIN 500 MG PO CAPS
500.0000 mg | ORAL_CAPSULE | Freq: Three times a day (TID) | ORAL | 0 refills | Status: AC
  Filled 2021-03-21: qty 30, 10d supply, fill #0

## 2021-03-21 NOTE — Progress Notes (Signed)
E-Visit for Sinus Problems  We are sorry that you are not feeling well.  Here is how we plan to help!  Based on what you have shared with me it looks like you have sinusitis.  Sinusitis is inflammation and infection in the sinus cavities of the head.  Based on your presentation I believe you most likely have Acute Bacterial Sinusitis.  This is an infection caused by bacteria and is treated with antibiotics. I have prescribed Amoxicillin 500mg  3x a day for 10 days You may use an oral decongestant such as Mucinex D or if you have glaucoma or high blood pressure use plain Mucinex. Saline nasal spray help and can safely be used as often as needed for congestion.  If you develop worsening sinus pain, fever or notice severe headache and vision changes, or if symptoms are not better after completion of antibiotic, please schedule an appointment with a health care provider.    Sinus infections are not as easily transmitted as other respiratory infection, however we still recommend that you avoid close contact with loved ones, especially the very young and elderly.  Remember to wash your hands thoroughly throughout the day as this is the number one way to prevent the spread of infection!  Home Care: Only take medications as instructed by your medical team. Complete the entire course of an antibiotic. Do not take these medications with alcohol. A steam or ultrasonic humidifier can help congestion.  You can place a towel over your head and breathe in the steam from hot water coming from a faucet. Avoid close contacts especially the very young and the elderly. Cover your mouth when you cough or sneeze. Always remember to wash your hands.  Get Help Right Away If: You develop worsening fever or sinus pain. You develop a severe head ache or visual changes. Your symptoms persist after you have completed your treatment plan.  Make sure you Understand these instructions. Will watch your condition. Will get  help right away if you are not doing well or get worse.  Thank you for choosing an e-visit.  Your e-visit answers were reviewed by a board certified advanced clinical practitioner to complete your personal care plan. Depending upon the condition, your plan could have included both over the counter or prescription medications.  Please review your pharmacy choice. Make sure the pharmacy is open so you can pick up prescription now. If there is a problem, you may contact your provider through [employer] and have the prescription routed to another pharmacy.  Your safety is important to [place]. If you have drug allergies check your prescription carefully.   For the next 24 hours you can use MyChart to ask questions about today's visit, request a non-urgent call back, or ask for a work or school excuse. You will get an email in the next two days asking about your experience. I hope that your e-visit has been valuable and will speed your recovery.  5-10 minutes spent reviewing and documenting in chart.

## 2021-03-21 NOTE — Addendum Note (Signed)
Addended by: Brunetta Jeans on: 03/21/2021 12:34 PM   Modules accepted: Orders

## 2021-04-10 ENCOUNTER — Other Ambulatory Visit: Payer: Self-pay

## 2021-04-14 ENCOUNTER — Telehealth: Payer: 59 | Admitting: Nurse Practitioner

## 2021-04-14 DIAGNOSIS — J0101 Acute recurrent maxillary sinusitis: Secondary | ICD-10-CM

## 2021-04-14 NOTE — Progress Notes (Signed)
Based on what you shared with me it looks like you have recurrent sinusitis,that should be evaluated in a face to face office visit. You wer just treated for sinus infection on 03/21/21. If it has already reoccurred, you wiill need a face to face visit for proper diagnosis and treatment.  NOTE: There will be NO CHARGE for this eVisit   If you are having a true medical emergency please call 911.      For an urgent face to face visit, [facility] has six urgent care centers for your convenience:     [facility] at  Get Driving Directions [phone] [address] [place], [place] [address]    [facility]) Get Driving Directions [phone] [place], [place] [address]  [facility]) Get Driving Directions [phone] [address] [place],  [place]  [address]  [facility] Get Driving Directions [phone] [place], [place], [place] [address]   [facility] Get Driving Directions  [phone] [address] [place], [place] [address]   [facility] Get Driving Directions [phone] [address], [place], [place] [address]  Your MyChart E-visit questionnaire answers were reviewed by a board certified advanced clinical practitioner to complete your personal care plan based on your specific symptoms.  Thank you for using e-Visits.

## 2021-04-23 DIAGNOSIS — H40053 Ocular hypertension, bilateral: Secondary | ICD-10-CM | POA: Diagnosis not present

## 2021-04-27 ENCOUNTER — Inpatient Hospital Stay: Payer: 59

## 2021-04-27 ENCOUNTER — Other Ambulatory Visit: Payer: Self-pay

## 2021-04-27 ENCOUNTER — Encounter: Payer: Self-pay | Admitting: Emergency Medicine

## 2021-04-27 ENCOUNTER — Emergency Department: Payer: 59

## 2021-04-27 ENCOUNTER — Inpatient Hospital Stay
Admission: EM | Admit: 2021-04-27 | Discharge: 2021-04-30 | DRG: 177 | Disposition: A | Discharge status: Home / Self Care | Payer: 59 | Attending: Internal Medicine | Admitting: Internal Medicine

## 2021-04-27 DIAGNOSIS — E785 Hyperlipidemia, unspecified: Secondary | ICD-10-CM | POA: Diagnosis present

## 2021-04-27 DIAGNOSIS — R609 Edema, unspecified: Secondary | ICD-10-CM | POA: Diagnosis not present

## 2021-04-27 DIAGNOSIS — K5900 Constipation, unspecified: Secondary | ICD-10-CM | POA: Diagnosis present

## 2021-04-27 DIAGNOSIS — R911 Solitary pulmonary nodule: Secondary | ICD-10-CM | POA: Diagnosis not present

## 2021-04-27 DIAGNOSIS — Z888 Allergy status to other drugs, medicaments and biological substances status: Secondary | ICD-10-CM | POA: Diagnosis not present

## 2021-04-27 DIAGNOSIS — J9600 Acute respiratory failure, unspecified whether with hypoxia or hypercapnia: Secondary | ICD-10-CM | POA: Diagnosis present

## 2021-04-27 DIAGNOSIS — R600 Localized edema: Secondary | ICD-10-CM | POA: Diagnosis not present

## 2021-04-27 DIAGNOSIS — Z8249 Family history of ischemic heart disease and other diseases of the circulatory system: Secondary | ICD-10-CM | POA: Diagnosis not present

## 2021-04-27 DIAGNOSIS — J1282 Pneumonia due to coronavirus disease 2019: Secondary | ICD-10-CM | POA: Diagnosis present

## 2021-04-27 DIAGNOSIS — J9811 Atelectasis: Secondary | ICD-10-CM | POA: Diagnosis not present

## 2021-04-27 DIAGNOSIS — R0902 Hypoxemia: Secondary | ICD-10-CM | POA: Diagnosis not present

## 2021-04-27 DIAGNOSIS — Z85828 Personal history of other malignant neoplasm of skin: Secondary | ICD-10-CM

## 2021-04-27 DIAGNOSIS — Z833 Family history of diabetes mellitus: Secondary | ICD-10-CM | POA: Diagnosis not present

## 2021-04-27 DIAGNOSIS — R059 Cough, unspecified: Secondary | ICD-10-CM

## 2021-04-27 DIAGNOSIS — R918 Other nonspecific abnormal finding of lung field: Secondary | ICD-10-CM

## 2021-04-27 DIAGNOSIS — Z7984 Long term (current) use of oral hypoglycemic drugs: Secondary | ICD-10-CM

## 2021-04-27 DIAGNOSIS — A419 Sepsis, unspecified organism: Secondary | ICD-10-CM

## 2021-04-27 DIAGNOSIS — R778 Other specified abnormalities of plasma proteins: Secondary | ICD-10-CM | POA: Diagnosis present

## 2021-04-27 DIAGNOSIS — I82409 Acute embolism and thrombosis of unspecified deep veins of unspecified lower extremity: Secondary | ICD-10-CM

## 2021-04-27 DIAGNOSIS — I10 Essential (primary) hypertension: Secondary | ICD-10-CM | POA: Diagnosis not present

## 2021-04-27 DIAGNOSIS — Z882 Allergy status to sulfonamides status: Secondary | ICD-10-CM | POA: Diagnosis not present

## 2021-04-27 DIAGNOSIS — I248 Other forms of acute ischemic heart disease: Secondary | ICD-10-CM | POA: Diagnosis present

## 2021-04-27 DIAGNOSIS — R000 Tachycardia, unspecified: Secondary | ICD-10-CM | POA: Diagnosis not present

## 2021-04-27 DIAGNOSIS — I214 Non-ST elevation (NSTEMI) myocardial infarction: Secondary | ICD-10-CM | POA: Diagnosis not present

## 2021-04-27 DIAGNOSIS — E1169 Type 2 diabetes mellitus with other specified complication: Secondary | ICD-10-CM | POA: Diagnosis not present

## 2021-04-27 DIAGNOSIS — Z6841 Body Mass Index (BMI) 40.0 and over, adult: Secondary | ICD-10-CM

## 2021-04-27 DIAGNOSIS — Z79899 Other long term (current) drug therapy: Secondary | ICD-10-CM

## 2021-04-27 DIAGNOSIS — K449 Diaphragmatic hernia without obstruction or gangrene: Secondary | ICD-10-CM | POA: Diagnosis not present

## 2021-04-27 DIAGNOSIS — U071 COVID-19: Secondary | ICD-10-CM | POA: Diagnosis not present

## 2021-04-27 DIAGNOSIS — E119 Type 2 diabetes mellitus without complications: Secondary | ICD-10-CM

## 2021-04-27 DIAGNOSIS — R0602 Shortness of breath: Secondary | ICD-10-CM | POA: Diagnosis not present

## 2021-04-27 DIAGNOSIS — Z885 Allergy status to narcotic agent status: Secondary | ICD-10-CM

## 2021-04-27 DIAGNOSIS — I081 Rheumatic disorders of both mitral and tricuspid valves: Secondary | ICD-10-CM | POA: Diagnosis not present

## 2021-04-27 DIAGNOSIS — R7989 Other specified abnormal findings of blood chemistry: Secondary | ICD-10-CM | POA: Diagnosis present

## 2021-04-27 DIAGNOSIS — H5789 Other specified disorders of eye and adnexa: Secondary | ICD-10-CM | POA: Diagnosis not present

## 2021-04-27 DIAGNOSIS — J9601 Acute respiratory failure with hypoxia: Principal | ICD-10-CM | POA: Diagnosis present

## 2021-04-27 DIAGNOSIS — E669 Obesity, unspecified: Secondary | ICD-10-CM | POA: Diagnosis present

## 2021-04-27 LAB — RESP PANEL BY RT-PCR (FLU A&B, COVID) ARPGX2
Influenza A by PCR: NEGATIVE
Influenza B by PCR: NEGATIVE
SARS Coronavirus 2 by RT PCR: POSITIVE — AB

## 2021-04-27 LAB — BASIC METABOLIC PANEL
Anion gap: 5 (ref 5–15)
BUN: 20 mg/dL (ref 8–23)
CO2: 29 mmol/L (ref 22–32)
Calcium: 8.5 mg/dL — ABNORMAL LOW (ref 8.9–10.3)
Chloride: 101 mmol/L (ref 98–111)
Creatinine, Ser: 0.78 mg/dL (ref 0.44–1.00)
GFR, Estimated: >60 mL/min (ref >60)
Glucose, Bld: 169 mg/dL — ABNORMAL HIGH (ref 70–99)
Potassium: 4 mmol/L (ref 3.5–5.1)
Sodium: 135 mmol/L (ref 135–145)

## 2021-04-27 LAB — CBC
HCT: 37.2 % (ref 36.0–46.0)
Hemoglobin: 11.3 g/dL — ABNORMAL LOW (ref 12.0–15.0)
MCH: 28.3 pg (ref 26.0–34.0)
MCHC: 30.4 g/dL (ref 30.0–36.0)
MCV: 93 fL (ref 80.0–100.0)
Platelets: 272 10*3/uL (ref 150–400)
RBC: 4 MIL/uL (ref 3.87–5.11)
RDW: 14.1 % (ref 11.5–15.5)
WBC: 12.3 10*3/uL — ABNORMAL HIGH (ref 4.0–10.5)
nRBC: 0 % (ref 0.0–0.2)

## 2021-04-27 LAB — BRAIN NATRIURETIC PEPTIDE: B Natriuretic Peptide: 115 pg/mL — ABNORMAL HIGH (ref 0.0–100.0)

## 2021-04-27 LAB — LACTIC ACID, PLASMA: Lactic Acid, Venous: 1.2 mmol/L (ref 0.5–1.9)

## 2021-04-27 LAB — PROCALCITONIN: Procalcitonin: 0.1 ng/mL

## 2021-04-27 LAB — TROPONIN I (HIGH SENSITIVITY)
Troponin I (High Sensitivity): 92 ng/L — ABNORMAL HIGH (ref <18)
Troponin I (High Sensitivity): 117 ng/L (ref <18)
Troponin I (High Sensitivity): 134 ng/L (ref <18)

## 2021-04-27 LAB — CBG MONITORING, ED: Glucose-Capillary: 146 mg/dL — ABNORMAL HIGH (ref 70–99)

## 2021-04-27 LAB — GLUCOSE, CAPILLARY: Glucose-Capillary: 224 mg/dL — ABNORMAL HIGH (ref 70–99)

## 2021-04-27 MED ORDER — ACETAMINOPHEN 650 MG RE SUPP: 650.0000 mg | Freq: Four times a day (QID) | RECTAL | Status: DC | PRN

## 2021-04-27 MED ORDER — GUAIFENESIN-DM 100-10 MG/5ML PO SYRP
5.0000 mL | ORAL_SOLUTION | ORAL | Status: DC | PRN
  Administered 2021-04-27 – 2021-04-29 (×7): 5 mL via ORAL
  Filled 2021-04-27 (×7): qty 5

## 2021-04-27 MED ORDER — LISINOPRIL 10 MG PO TABS
10.0000 mg | ORAL_TABLET | Freq: Every day | ORAL | Status: DC
  Administered 2021-04-27 – 2021-04-30 (×4): 10 mg via ORAL
  Filled 2021-04-27 (×4): qty 1

## 2021-04-27 MED ORDER — IOHEXOL 350 MG/ML SOLN
75.0000 mL | Freq: Once | INTRAVENOUS | Status: AC | PRN
  Administered 2021-04-27: 100 mL via INTRAVENOUS

## 2021-04-27 MED ORDER — SODIUM CHLORIDE 0.9 % IV SOLN
200.0000 mg | Freq: Once | INTRAVENOUS | Status: AC
  Administered 2021-04-27: 200 mg via INTRAVENOUS
  Filled 2021-04-27: qty 200

## 2021-04-27 MED ORDER — LACTATED RINGERS IV SOLN: INTRAVENOUS | Status: DC

## 2021-04-27 MED ORDER — ONDANSETRON HCL 4 MG PO TABS: 4.0000 mg | ORAL_TABLET | Freq: Four times a day (QID) | ORAL | Status: DC | PRN

## 2021-04-27 MED ORDER — INSULIN DETEMIR 100 UNIT/ML ~~LOC~~ SOLN
0.1500 [IU]/kg | Freq: Two times a day (BID) | SUBCUTANEOUS | Status: DC
  Administered 2021-04-27 – 2021-04-30 (×6): 20 [IU] via SUBCUTANEOUS
  Filled 2021-04-27 (×8): qty 0.2

## 2021-04-27 MED ORDER — TRAZODONE HCL 50 MG PO TABS
50.0000 mg | ORAL_TABLET | Freq: Every day | ORAL | Status: DC
Start: 2021-04-27 — End: 2021-04-30
  Administered 2021-04-27 – 2021-04-29 (×3): 50 mg via ORAL
  Filled 2021-04-27 (×3): qty 1

## 2021-04-27 MED ORDER — ACETAMINOPHEN 325 MG PO TABS
650.0000 mg | ORAL_TABLET | Freq: Four times a day (QID) | ORAL | Status: DC | PRN
  Filled 2021-04-27: qty 2

## 2021-04-27 MED ORDER — SODIUM CHLORIDE 0.9 % IV SOLN
2.0000 g | INTRAVENOUS | Status: DC
  Administered 2021-04-27: 2 g via INTRAVENOUS
  Filled 2021-04-27 (×2): qty 20

## 2021-04-27 MED ORDER — ONDANSETRON HCL 4 MG/2ML IJ SOLN: 4.0000 mg | Freq: Four times a day (QID) | INTRAMUSCULAR | Status: DC | PRN

## 2021-04-27 MED ORDER — ZINC SULFATE 220 (50 ZN) MG PO CAPS
220.0000 mg | ORAL_CAPSULE | Freq: Every day | ORAL | Status: DC
Start: 2021-04-27 — End: 2021-04-30
  Administered 2021-04-27 – 2021-04-30 (×4): 220 mg via ORAL
  Filled 2021-04-27 (×4): qty 1

## 2021-04-27 MED ORDER — ROSUVASTATIN CALCIUM 5 MG PO TABS: 5.0000 mg | ORAL_TABLET | Freq: Every day | ORAL | Status: DC

## 2021-04-27 MED ORDER — SODIUM CHLORIDE 0.9 % IV SOLN
100.0000 mg | Freq: Every day | INTRAVENOUS | Status: DC
  Administered 2021-04-28 – 2021-04-30 (×3): 100 mg via INTRAVENOUS
  Filled 2021-04-27 (×2): qty 100
  Filled 2021-04-27: qty 20
  Filled 2021-04-27: qty 100

## 2021-04-27 MED ORDER — DEXAMETHASONE SODIUM PHOSPHATE 10 MG/ML IJ SOLN
6.0000 mg | INTRAMUSCULAR | Status: DC
  Administered 2021-04-27 – 2021-04-30 (×4): 6 mg via INTRAVENOUS
  Filled 2021-04-27 (×4): qty 1

## 2021-04-27 MED ORDER — INSULIN ASPART 100 UNIT/ML IJ SOLN
0.0000 [IU] | Freq: Three times a day (TID) | INTRAMUSCULAR | Status: DC
  Administered 2021-04-27: 4 [IU] via SUBCUTANEOUS
  Administered 2021-04-28 (×2): 3 [IU] via SUBCUTANEOUS
  Administered 2021-04-28: 09:00:00 4 [IU] via SUBCUTANEOUS
  Administered 2021-04-29 – 2021-04-30 (×3): 3 [IU] via SUBCUTANEOUS
  Filled 2021-04-27 (×7): qty 1

## 2021-04-27 MED ORDER — ENOXAPARIN SODIUM 80 MG/0.8ML IJ SOSY
0.5000 mg/kg | PREFILLED_SYRINGE | INTRAMUSCULAR | Status: DC
  Filled 2021-04-27: qty 0.68

## 2021-04-27 MED ORDER — ASPIRIN EC 81 MG PO TBEC
81.0000 mg | DELAYED_RELEASE_TABLET | Freq: Every day | ORAL | Status: DC
  Administered 2021-04-27 – 2021-04-30 (×4): 81 mg via ORAL
  Filled 2021-04-27 (×4): qty 1

## 2021-04-27 MED ORDER — ALBUTEROL SULFATE HFA 108 (90 BASE) MCG/ACT IN AERS
2.0000 | INHALATION_SPRAY | RESPIRATORY_TRACT | Status: DC | PRN
  Filled 2021-04-27: qty 6.7

## 2021-04-27 MED ORDER — ENOXAPARIN SODIUM 40 MG/0.4ML IJ SOSY: 40.0000 mg | PREFILLED_SYRINGE | INTRAMUSCULAR | Status: DC

## 2021-04-27 MED ORDER — ASCORBIC ACID 500 MG PO TABS
500.0000 mg | ORAL_TABLET | Freq: Two times a day (BID) | ORAL | Status: DC
  Administered 2021-04-27 – 2021-04-30 (×6): 500 mg via ORAL
  Filled 2021-04-27 (×6): qty 1

## 2021-04-27 MED ORDER — SODIUM CHLORIDE 0.9 % IV SOLN
500.0000 mg | INTRAVENOUS | Status: DC
  Administered 2021-04-27: 500 mg via INTRAVENOUS
  Filled 2021-04-27 (×3): qty 5

## 2021-04-27 NOTE — ED Provider Notes (Signed)
[facility] Provider Note    Event Date/Time   First MD Initiated Contact with Patient 04/27/21 1535     (approximate)   History   Hypoxia and Tachycardia   HPI  Carolyn Levine is a 61 y.o. female no significant past medical history other than diabetes and some hypertension presents to the ER for evaluation of 3 days of cough congestion generalized malaise.  Thought she had a sinus infection so she went to the walk-in clinic.  Has been feeling generalized malaise particularly with ambulation and when she was checked and was found to be hypoxic to 70% on room air and tachycardic.  She not complaining of any chest pain at this time.  Does feel a significant urge to cough.  Denies any history of heart or lung disease does not currently smoke.  She is vaccinated against COVID.     Physical Exam   Triage Vital Signs: ED Triage Vitals [04/27/21 1347]  Enc Vitals Group     BP (!) 157/99     Pulse Rate (!) 120     Resp 20     Temp 99.5 F (37.5 C)     Temp Source Oral     SpO2 100 %     Weight 298 lb 15.1 oz (135.6 kg)     Height 5\' 5"  (1.651 m)     Head Circumference      Peak Flow      Pain Score 0     Pain Loc      Pain Edu?      Excl. in [place]?     Most recent vital signs: Vitals:   04/27/21 1347  BP: (!) 157/99  Pulse: (!) 120  Resp: 20  Temp: 99.5 F (37.5 C)  SpO2: 100%     Constitutional: Alert  Eyes: Conjunctivae are normal.  Head: Atraumatic. Nose: No congestion/rhinnorhea. Mouth/Throat: Mucous membranes are moist.   Neck: Painless ROM.  Cardiovascular:   Good peripheral circulation. Respiratory: Normal respiratory effort.  Bilateral inspiratory crackles Gastrointestinal: Soft and nontender.  Musculoskeletal:  no deformity Neurologic:  MAE spontaneously. No gross focal neurologic deficits are appreciated.  Skin:  Skin is warm, dry and intact. No rash noted. Psychiatric: Mood and affect are normal. Speech and behavior are  normal.    ED Results / Procedures / Treatments   Labs (all labs ordered are listed, but only abnormal results are displayed) Labs Reviewed  RESP PANEL BY RT-PCR (FLU A&B, COVID) ARPGX2 - Abnormal; Notable for the following components:      Result Value   SARS Coronavirus 2 by RT PCR POSITIVE (*)    All other components within normal limits  BASIC METABOLIC PANEL - Abnormal; Notable for the following components:   Glucose, Bld 169 (*)    Calcium 8.5 (*)    All other components within normal limits  CBC - Abnormal; Notable for the following components:   WBC 12.3 (*)    Hemoglobin 11.3 (*)    All other components within normal limits  TROPONIN I (HIGH SENSITIVITY) - Abnormal; Notable for the following components:   Troponin I (High Sensitivity) 92 (*)    All other components within normal limits  CULTURE, BLOOD (SINGLE)  CULTURE, BLOOD (ROUTINE X 2)  CULTURE, BLOOD (ROUTINE X 2)  PROCALCITONIN  LACTIC ACID, PLASMA  BRAIN NATRIURETIC PEPTIDE  TROPONIN I (HIGH SENSITIVITY)     EKG  ED ECG REPORT I, Merlyn Lot, the attending physician, personally  viewed and interpreted this ECG.   Date: 04/27/2021  EKG Time: 13:50  Rate: 120  Rhythm: sinus  Axis: normal  Intervals:  normal  ST&T Change: no stemi, nonspecific t wave abn    RADIOLOGY Please see ED Course for my review and interpretation.  I personally reviewed all radiographic images ordered to evaluate for the above acute complaints and reviewed radiology reports and findings.  These findings were personally discussed with the patient.  Please see medical record for radiology report.    PROCEDURES:  Critical Care performed: Yes, see critical care procedure note(s)  .Critical Care Performed by: Merlyn Lot, MD Authorized by: Merlyn Lot, MD   Critical care provider statement:    Critical care time (minutes):  32   Critical care was necessary to treat or prevent imminent or life-threatening  deterioration of the following conditions:  Respiratory failure and sepsis   Critical care was time spent personally by me on the following activities:  Ordering and performing treatments and interventions, ordering and review of laboratory studies, ordering and review of radiographic studies, pulse oximetry, re-evaluation of patient's condition, review of old charts, obtaining history from patient or surrogate, examination of patient, evaluation of patient's response to treatment, discussions with primary provider, discussions with consultants and development of treatment plan with patient or surrogate   MEDICATIONS ORDERED IN ED: Medications  lactated ringers infusion (has no administration in time range)  cefTRIAXone (ROCEPHIN) 2 g in sodium chloride 0.9 % 100 mL IVPB (has no administration in time range)  azithromycin (ZITHROMAX) 500 mg in sodium chloride 0.9 % 250 mL IVPB (has no administration in time range)     IMPRESSION / MDM / [place] / ED COURSE  I reviewed the triage vital signs and the nursing notes.                              Differential diagnosis includes, but is not limited to, covid, Asthma, copd, CHF, pna, ptx, malignancy, Pe, anemia   Patient presenting with symptoms as described above found to be hypoxic on room air down to 70% tachycardic.  Low-grade temperature concern for sepsis and pneumonia.  Blood work sent for above differential does show evidence of acute leukocytosis her troponin is elevated to 90 does not have any ST changes on EKG and she is denying any chest pain I think this is most likely demand ischemia in the setting of her acute respiratory failure with hypoxia.  I personally reviewed her chest x-ray I do not appreciate any sign of pneumothorax or mass.  Does appear to have right lower lobe consolidation consistent with pneumonia in this clinical setting.  Her abdominal exam is otherwise soft and benign.  Renal function normal.  Will order  gentle IV hydration will order broad-spectrum antibiotics to cover for bacterial pneumonia while awaiting the procalcitonin result have also ordered remdesivir.  The patient will be placed on continuous pulse oximetry and telemetry for monitoring.  Laboratory evaluation will be sent to evaluate for the above complaints.      Clinical Course as of 04/27/21 1555  Fri Apr 27, 2021  1554 Case was discussed in consultation with hospitalist who has accepted patient to her service.  Patient remains hemodynamically stable and appropriate for admission to the hospital [PR]    Clinical Course User Index [PR] Merlyn Lot, MD     FINAL CLINICAL IMPRESSION(S) / ED DIAGNOSES   Final diagnoses:  Acute respiratory failure with hypoxia ([place])  COVID-19     Rx / DC Orders   ED Discharge Orders     None        Note:  This document was prepared using Dragon voice recognition software and may include unintentional dictation errors.    Merlyn Lot, MD 04/27/21 [phone]

## 2021-04-27 NOTE — ED Notes (Signed)
RN aware bed assigned ?

## 2021-04-27 NOTE — Progress Notes (Signed)
PHARMACIST - PHYSICIAN COMMUNICATION  CONCERNING:  Enoxaparin (Lovenox) for DVT Prophylaxis    RECOMMENDATION: Patient was prescribed enoxaprin 40mg  q24 hours for VTE prophylaxis.   Filed Weights   04/27/21 1347  Weight: 135.6 kg (298 lb 15.1 oz)    Body mass index is 49.75 kg/m.  Estimated Creatinine Clearance: 101.8 mL/min (by C-G formula based on SCr of 0.78 mg/dL).   Based on [place] patient is candidate for enoxaparin 0.5mg /kg TBW SQ every 24 hours based on BMI being >30.   DESCRIPTION: Pharmacy has adjusted enoxaparin dose per [facility] policy.  Patient is now receiving enoxaparin 67.5 mg every 24 hours    Sherilyn Banker, PharmD Clinical Pharmacist  04/27/2021 4:39 PM

## 2021-04-27 NOTE — Sepsis Progress Note (Signed)
eLink is monitoring this Code Sepsis. °

## 2021-04-27 NOTE — H&P (Addendum)
History and Physical    Patient: Carolyn Levine TML:[id number] DOB: May 09, 1960 DOA: 04/27/2021 DOS: the patient was seen and examined on 04/27/2021 PCP: [name], MD  Patient coming from: Home  Chief Complaint:  Chief Complaint  Patient presents with   Hypoxia   Tachycardia    HPI: Carolyn Levine is a 61 y.o. female with medical history significant for morbid obesity (BMI 49.75), diabetes mellitus, hypertension who presents to the ER from her primary care provider's office for evaluation of hypoxia and tachycardia. Patient states that she had gone to her primary care provider for evaluation of what she thought was a sinus infection.  She has had nasal congestion, cough productive of yellow phlegm, head cold as well as fever.  She was found to have room air pulse oximetry in the 70s and was tachycardic with heart rates in the 120s.  She was placed on 3 L of oxygen and sent to the emergency room for further evaluation. Symptoms associated with shortness of breath mostly with exertion, nausea and diarrhea but she denies having any myalgias, no dizziness, no lightheadedness, no abdominal pain, no blurred vision, no focal deficit, no orthopnea, no palpitations.  Review of Systems: As mentioned in the history of present illness. All other systems reviewed and are negative. Past Medical History:  Diagnosis Date   Allergy    Cancer ([name])    basal cell skin ca   Diabetes mellitus without complication ([name])    Hyperlipidemia    Hypertension    Rosacea    Past Surgical History:  Procedure Laterality Date   CHOLECYSTECTOMY  1994   Social History:  reports that she has never smoked. She has never used smokeless tobacco. She reports that she does not drink alcohol and does not use drugs.  Allergies  Allergen Reactions   Codeine Sulfate    Simvastatin     Other reaction(s): Muscle Pain   Sulphadimidine [Sulfamethazine]     Family History  Problem Relation Age of Onset   Allergy  (severe) Sister    Cancer Mother    Diabetes Father    Heart failure Father    Diabetes Sister    Breast cancer Neg Hx     Prior to Admission medications   Medication Sig Start Date End Date Taking? Authorizing Provider  albuterol (PROVENTIL HFA;VENTOLIN HFA) 108 (90 Base) MCG/ACT inhaler Inhale 2 puffs into the lungs every 4 (four) hours as needed for wheezing or shortness of breath. 01/19/18   Darlin Priestly, PA-C  COVID-19 mRNA Vac-TriS, Pfizer, (PFIZER-BIONT COVID-19 VAC-TRIS) SUSP injection Inject into the muscle. 07/07/20   Carlyle Basques, MD  guaiFENesin-dextromethorphan (ROBITUSSIN DM) 100-10 MG/5ML syrup Take 5 mLs by mouth every 4 (four) hours as needed for cough. 01/19/18   Darlin Priestly, PA-C  influenza vac split quadrivalent PF (FLUARIX) 0.5 ML injection Inject into the muscle. 01/02/21     lisinopril (PRINIVIL,ZESTRIL) 10 MG tablet Take 10 mg by mouth daily.    [provider]  lisinopril (ZESTRIL) 10 MG tablet TAKE 1 TABLET BY MOUTH ONCE DAILY 01/12/21 01/12/22    metFORMIN (GLUCOPHAGE) 1000 MG tablet Take 1,000 mg by mouth 2 (two) times daily with a meal.    [provider]  metFORMIN (GLUCOPHAGE) 1000 MG tablet TAKE ONE TABLET BY MOUTH 2 TIMES A DAY 01/31/20 01/30/21  [name], MD  metFORMIN (GLUCOPHAGE) 1000 MG tablet Take 1 tablet (1,000 mg total) by mouth 2 (two) times daily 02/09/21     pioglitazone (ACTOS)  30 MG tablet TAKE 1 TABLET BY MOUTH ONCE DAILY 10/05/20 10/05/21    predniSONE (DELTASONE) 10 MG tablet Take 5 tabs po qd x 2 days, then 4 tabs po qd x 2 days, then 3 tabs po qd x 2 days, then 2 tabs po qd x 2 days, then 1 tab po qd x 2 days 01/23/18   Darlin Priestly, PA-C  rosuvastatin (CRESTOR) 5 MG tablet Take 5 mg by mouth daily. Takes every three days    [provider]  traZODone (DESYREL) 50 MG tablet TAKE 1 TABLET BY MOUTH NIGHTLY 07/15/19 07/14/20  [name], MD  traZODone (DESYREL) 50 MG tablet Take 1 tablet (50 mg total) by  mouth nightly 07/14/20     traZODone (DESYREL) 50 MG tablet Take 1 tablet (50 mg total) by mouth at bedtime 01/29/21       Physical Exam: Vitals:   04/27/21 1347  BP: (!) 157/99  Pulse: (!) 120  Resp: 20  Temp: 99.5 F (37.5 C)  TempSrc: Oral  SpO2: 100%  Weight: 135.6 kg  Height: 5\' 5"  (1.651 m)   Physical Exam Vitals and nursing note reviewed.  Constitutional:      Appearance: Normal appearance. She is obese.  HENT:     Head: Normocephalic and atraumatic.     Nose: Congestion present.     Mouth/Throat:     Mouth: Mucous membranes are moist.  Eyes:     Pupils: Pupils are equal, round, and reactive to light.  Cardiovascular:     Rate and Rhythm: Regular rhythm. Tachycardia present.  Pulmonary:     Effort: Pulmonary effort is normal.     Breath sounds: Rhonchi present.  Abdominal:     General: Bowel sounds are normal.     Palpations: Abdomen is soft.  Musculoskeletal:        General: Normal range of motion.     Cervical back: Normal range of motion.     Right lower leg: Edema present.     Left lower leg: Edema present.  Skin:    General: Skin is warm and dry.     Findings: Bruising present.     Comments: Ecchymosis involving the right upper back from a fall (POA)  Neurological:     General: No focal deficit present.     Mental Status: She is alert and oriented to person, place, and time.  Psychiatric:        Mood and Affect: Mood normal.        Behavior: Behavior normal.     Data Review Results are pending, will review when available. Notes from primary care and specialist visits, past discharge summaries. Prior diagnostic testing as applicable to current admission diagnoses Updated medications and problem lists for reconciliation ED course, including vitals, labs, imaging, treatment and response to treatment Triage notes and ED providers notes Labs reviewed troponin 92, white count 12.3 SARS coronavirus 2 PCR was positive Chest x-ray reviewed by me shows  elevation of the right hemidiaphragm.  Opacity within the adjacent right lung base, which may reflect atelectasis and/or pneumonia. Twelve-lead EKG reviewed by me shows sinus tachycardia with low voltage QRS. I ordered a CT angiogram of the chest and will follow-up results Assessment and Plan: Principal Problem:   Pneumonia due to COVID-19 virus Active Problems:   Type 2 diabetes mellitus ([place])   Obesity   Acute respiratory failure ([place])   Acute respiratory failure due to COVID-19 ([place])   Sepsis due to pneumonia ([place])  Elevated troponin    Sepsis secondary to pneumonia As evidenced by low-grade fever with a Tmax of 99.5, tachycardia, leukocytosis and imaging showing an opacity in the right lung base. Place patient empirically on antibiotic therapy with Rocephin and Zithromax IV fluid resuscitation Follow-up results of lactic acid, procalcitonin Follow-up results of blood cultures    Pneumonia due to COVID-19 with superimposed bacterial infection/acute respiratory failure Patient is vaccinated and has received 2 booster shots We will treat with remdesivir per protocol Patient is hypoxic and is currently on 3 L of oxygen to maintain pulse oximetry greater than 92% Place patient on Decadron 6 mg IV daily Supportive care with antitussives, bronchodilator therapy and vitamins      Diabetes mellitus Hold metformin and Actos Glycemic control with long-acting insulin as well as sliding scale coverage Maintain consistent carbohydrate diet     Elevated troponin Most likely secondary to demand ischemia from hypoxemia We will cycle cardiac enzymes Place patient on aspirin 81 mg daily Obtain 2D echocardiogram to assess LVEF and rule out regional wall motion abnormality Consult cardiology    Morbid obesity (BMI 23.36) Complicates overall prognosis and care    Hypertension Continue Lisinopril  Advance Care Planning:   Code Status: Full Code   Consults: None  Family  Communication: Greater than 50% of time was spent discussing patient's condition and plan of care with patient and her husband at the bedside.  All questions and concerns have been addressed.  They verbalized and agree with the plan.  Severity of Illness: The appropriate patient status for this patient is INPATIENT. Inpatient status is judged to be reasonable and necessary in order to provide the required intensity of service to ensure the patient's safety. The patient's presenting symptoms, physical exam findings, and initial radiographic and laboratory data in the context of their chronic comorbidities is felt to place them at high risk for further clinical deterioration. Furthermore, it is not anticipated that the patient will be medically stable for discharge from the hospital within 2 midnights of admission.   * I certify that at the point of admission it is my clinical judgment that the patient will require inpatient hospital care spanning beyond 2 midnights from the point of admission due to high intensity of service, high risk for further deterioration and high frequency of surveillance required.*  Author: Collier Bullock, MD 04/27/2021 4:53 PM  For on call review [email or web address].

## 2021-04-27 NOTE — ED Triage Notes (Signed)
Pt comes into the ED via [facility] clinic for hypoxia and tachycardia.  Pt went to [facility] for a sinus infection with symptoms including cough, congestion and headache.  Pt states when she got to the [facility], they found her room air saturation to be in the 70's and her heart rate elevated in the 120's. Pt denies any h/o CHF, COPD, or asthma.  PT states she has been having to sit up for sleeping.  Pt currently on 3L and has even and unlabored respirations.

## 2021-04-27 NOTE — Progress Notes (Signed)
Remdesivir - Pharmacy Brief Note   O:  ALT: N/A CXR: Elevation of the right hemidiaphragm. Opacity within the adjacent right lung base, which may reflect atelectasis and/or pneumonia SpO2: 100% on 3 L [place]   A/P:  Remdesivir 200 mg IVPB once followed by 100 mg IVPB daily x 4 days.   Sherilyn Banker, PharmD Clinical Pharmacist  04/27/2021 4:05 PM

## 2021-04-27 NOTE — Plan of Care (Signed)
Dr. Ree Kida of critical Troponin  117.

## 2021-04-28 ENCOUNTER — Inpatient Hospital Stay: Payer: 59

## 2021-04-28 DIAGNOSIS — J9600 Acute respiratory failure, unspecified whether with hypoxia or hypercapnia: Secondary | ICD-10-CM

## 2021-04-28 DIAGNOSIS — Z6841 Body Mass Index (BMI) 40.0 and over, adult: Secondary | ICD-10-CM

## 2021-04-28 DIAGNOSIS — E1169 Type 2 diabetes mellitus with other specified complication: Secondary | ICD-10-CM

## 2021-04-28 DIAGNOSIS — I10 Essential (primary) hypertension: Secondary | ICD-10-CM

## 2021-04-28 DIAGNOSIS — R778 Other specified abnormalities of plasma proteins: Secondary | ICD-10-CM

## 2021-04-28 DIAGNOSIS — E785 Hyperlipidemia, unspecified: Secondary | ICD-10-CM

## 2021-04-28 DIAGNOSIS — R918 Other nonspecific abnormal finding of lung field: Secondary | ICD-10-CM

## 2021-04-28 LAB — CBC
HCT: 33.6 % — ABNORMAL LOW (ref 36.0–46.0)
Hemoglobin: 10.3 g/dL — ABNORMAL LOW (ref 12.0–15.0)
MCH: 28.5 pg (ref 26.0–34.0)
MCHC: 30.7 g/dL (ref 30.0–36.0)
MCV: 93.1 fL (ref 80.0–100.0)
Platelets: 262 10*3/uL (ref 150–400)
RBC: 3.61 MIL/uL — ABNORMAL LOW (ref 3.87–5.11)
RDW: 13.8 % (ref 11.5–15.5)
WBC: 11.1 10*3/uL — ABNORMAL HIGH (ref 4.0–10.5)
nRBC: 0 % (ref 0.0–0.2)

## 2021-04-28 LAB — GLUCOSE, CAPILLARY
Glucose-Capillary: 154 mg/dL — ABNORMAL HIGH (ref 70–99)
Glucose-Capillary: 131 mg/dL — ABNORMAL HIGH (ref 70–99)
Glucose-Capillary: 130 mg/dL — ABNORMAL HIGH (ref 70–99)
Glucose-Capillary: 205 mg/dL — ABNORMAL HIGH (ref 70–99)

## 2021-04-28 LAB — BASIC METABOLIC PANEL
Anion gap: 4 — ABNORMAL LOW (ref 5–15)
BUN: 20 mg/dL (ref 8–23)
CO2: 31 mmol/L (ref 22–32)
Calcium: 8.4 mg/dL — ABNORMAL LOW (ref 8.9–10.3)
Chloride: 101 mmol/L (ref 98–111)
Creatinine, Ser: 0.69 mg/dL (ref 0.44–1.00)
GFR, Estimated: >60 mL/min (ref >60)
Glucose, Bld: 230 mg/dL — ABNORMAL HIGH (ref 70–99)
Potassium: 4.3 mmol/L (ref 3.5–5.1)
Sodium: 136 mmol/L (ref 135–145)

## 2021-04-28 LAB — HIV ANTIBODY (ROUTINE TESTING W REFLEX): HIV Screen 4th Generation wRfx: NONREACTIVE

## 2021-04-28 LAB — PROTIME-INR
INR: 1.1 (ref 0.8–1.2)
Prothrombin Time: 14.2 seconds (ref 11.4–15.2)

## 2021-04-28 LAB — CORTISOL-AM, BLOOD: Cortisol - AM: 2.7 ug/dL — ABNORMAL LOW (ref 6.7–22.6)

## 2021-04-28 LAB — TROPONIN I (HIGH SENSITIVITY): Troponin I (High Sensitivity): 73 ng/L — ABNORMAL HIGH (ref <18)

## 2021-04-28 LAB — PROCALCITONIN: Procalcitonin: <0.1 ng/mL

## 2021-04-28 MED ORDER — ROSUVASTATIN CALCIUM 10 MG PO TABS
5.0000 mg | ORAL_TABLET | Freq: Every day | ORAL | Status: DC
  Administered 2021-04-29 – 2021-04-30 (×2): 5 mg via ORAL
  Filled 2021-04-28 (×2): qty 1

## 2021-04-28 MED ORDER — ENOXAPARIN SODIUM 80 MG/0.8ML IJ SOSY
0.5000 mg/kg | PREFILLED_SYRINGE | INTRAMUSCULAR | Status: DC
  Administered 2021-04-28 – 2021-04-29 (×2): 80 mg via SUBCUTANEOUS
  Filled 2021-04-28 (×3): qty 0.8

## 2021-04-28 NOTE — Assessment & Plan Note (Addendum)
Sent in for a pulse ox in the 70s at PCPs office.  Pulse ox 83% yesterday.  With ambulation the patient was able to hold her sats in the 90s for the most part and dropped down to 88 for a brief period of time.  Patient does not qualify for oxygen at home.  Patient received 4 days of remdesivir.  We will give prednisone taper upon disposition.

## 2021-04-28 NOTE — Assessment & Plan Note (Addendum)
Received 4 days of remdesivir here.  We will give prednisone taper upon disposition.

## 2021-04-28 NOTE — Progress Notes (Signed)
Right AC peripheral IV line is not patent. Abx infusion stopped. IV Team notified to start new line.

## 2021-04-28 NOTE — Assessment & Plan Note (Addendum)
Likely secondary to acute hypoxic respiratory failure.  Echocardiogram showed a normal ejection fraction and mild mitral regurgitation and mild to moderate tricuspid regurgitation.

## 2021-04-28 NOTE — Assessment & Plan Note (Signed)
Continue lisinopril

## 2021-04-28 NOTE — Assessment & Plan Note (Addendum)
Can go back home on oral medications.  Sugars may be high for the next couple days while on steroids

## 2021-04-28 NOTE — Assessment & Plan Note (Signed)
BMI 59.54

## 2021-04-28 NOTE — Assessment & Plan Note (Signed)
Patient will need a repeat CT scan in about 6 months for further evaluation.

## 2021-04-28 NOTE — Consult Note (Signed)
[facility] Consultation Note  Patient ID: MATTALYN ANDEREGG, MRN: [id number], DOB/AGE: 1961-03-10 61 y.o. Admit date: 04/27/2021   Date of Consult: 04/28/2021 Primary Physician: [name], MD Primary Cardiologist: None  Chief Complaint:  Chief Complaint  Patient presents with   Hypoxia   Tachycardia   Reason for Consult:  Elevated troponin  HPI: 61 y.o. female with known hypertension hyperlipidemia diabetes and obesity for which the patient has recently had an acute onset of severe shortness of breath cough and congestion.  The patient was seen in the emergency room at which time the EKG showed sinus tachycardia otherwise normal EKG.  The patient was diagnosed with COVID-pneumonia and a CT scan showed minimal atherosclerosis of the aorta but no evidence of calcification of coronary arteries.  She did have a troponin of 92/1-17/134 most consistent with demand ischemia hypoxia and current illness.  This was evident by admission with her oxygenation being below 90%.  She has not had any apparent congestive heart failure signs or symptoms and no evidence of acute coronary syndrome and or chest pain  Past Medical History:  Diagnosis Date   Allergy    Cancer ([place])    basal cell skin ca   Diabetes mellitus without complication ([place])    Hyperlipidemia    Hypertension    Rosacea       Surgical History:  Past Surgical History:  Procedure Laterality Date   CHOLECYSTECTOMY  1994     Home Meds: Prior to Admission medications   Medication Sig Start Date End Date Taking? Authorizing Provider  lisinopril (ZESTRIL) 10 MG tablet TAKE 1 TABLET BY MOUTH ONCE DAILY 01/12/21 01/12/22 Yes   metFORMIN (GLUCOPHAGE) 1000 MG tablet Take 1 tablet (1,000 mg total) by mouth 2 (two) times daily 02/09/21  Yes   pioglitazone (ACTOS) 30 MG tablet TAKE 1 TABLET BY MOUTH ONCE DAILY 10/05/20 10/05/21 Yes   rosuvastatin (CRESTOR) 5 MG tablet Take 5 mg by mouth daily. Takes every three days    Yes [provider]  traZODone (DESYREL) 50 MG tablet Take 1 tablet (50 mg total) by mouth at bedtime 01/29/21  Yes   albuterol (PROVENTIL HFA;VENTOLIN HFA) 108 (90 Base) MCG/ACT inhaler Inhale 2 puffs into the lungs every 4 (four) hours as needed for wheezing or shortness of breath. Patient not taking: Reported on 04/27/2021 01/19/18   Darlin Priestly, PA-C  COVID-19 mRNA Vac-TriS, Pfizer, (PFIZER-BIONT COVID-19 VAC-TRIS) SUSP injection Inject into the muscle. 07/07/20   Carlyle Basques, MD  guaiFENesin-dextromethorphan (ROBITUSSIN DM) 100-10 MG/5ML syrup Take 5 mLs by mouth every 4 (four) hours as needed for cough. Patient not taking: Reported on 04/27/2021 01/19/18   Darlin Priestly, PA-C  influenza vac split quadrivalent PF (FLUARIX) 0.5 ML injection Inject into the muscle. 01/02/21     lisinopril (PRINIVIL,ZESTRIL) 10 MG tablet Take 10 mg by mouth daily.    [provider]  metFORMIN (GLUCOPHAGE) 1000 MG tablet Take 1,000 mg by mouth 2 (two) times daily with a meal.    [provider]  metFORMIN (GLUCOPHAGE) 1000 MG tablet TAKE ONE TABLET BY MOUTH 2 TIMES A DAY 01/31/20 01/30/21  [name], MD  predniSONE (DELTASONE) 10 MG tablet Take 5 tabs po qd x 2 days, then 4 tabs po qd x 2 days, then 3 tabs po qd x 2 days, then 2 tabs po qd x 2 days, then 1 tab po qd x 2 days Patient not taking: Reported on 04/27/2021 01/23/18   Darlin Priestly, PA-C  traZODone (DESYREL) 50 MG tablet TAKE 1 TABLET BY MOUTH NIGHTLY 07/15/19 07/14/20  [name], MD  traZODone (DESYREL) 50 MG tablet Take 1 tablet (50 mg total) by mouth nightly 07/14/20       Inpatient Medications:   vitamin C  500 mg Oral BID   aspirin EC  81 mg Oral Daily   dexamethasone (DECADRON) injection  6 mg Intravenous Q24H   enoxaparin (LOVENOX) injection  0.5 mg/kg Subcutaneous Q24H   insulin aspart  0-20 Units Subcutaneous TID WC   insulin detemir  0.15 Units/kg Subcutaneous BID   lisinopril  10 mg Oral  Daily   traZODone  50 mg Oral QHS   zinc sulfate  220 mg Oral Daily    azithromycin 250 mL/hr at 04/28/21 0054   cefTRIAXone (ROCEPHIN)  IV Stopped (04/27/21 1838)   remdesivir 100 mg in NS 100 mL      Allergies:  Allergies  Allergen Reactions   Codeine Sulfate    Simvastatin     Other reaction(s): Muscle Pain   Sulphadimidine [Sulfamethazine]     Social History   Socioeconomic History   Marital status: Married    Spouse name: Not on file   Number of children: Not on file   Years of education: Not on file   Highest education level: Not on file  Occupational History   Not on file  Tobacco Use   Smoking status: Never   Smokeless tobacco: Never  Substance and Sexual Activity   Alcohol use: No    Alcohol/week: 0.0 standard drinks   Drug use: No   Sexual activity: Not on file  Other Topics Concern   Not on file  Social History Narrative   Not on file   Social Determinants of Health   Financial Resource Strain: Not on file  Food Insecurity: Not on file  Transportation Needs: Not on file  Physical Activity: Not on file  Stress: Not on file  Social Connections: Not on file  Intimate Partner Violence: Not on file     Family History  Problem Relation Age of Onset   Allergy (severe) Sister    Cancer Mother    Diabetes Father    Heart failure Father    Diabetes Sister    Breast cancer Neg Hx      Review of Systems Positive for shortness of breath cough congestion Negative for: General:  chills, fever, night sweats or weight changes.  Cardiovascular: PND orthopnea syncope dizziness  Dermatological skin lesions rashes Respiratory: Positive for cough congestion Urologic: Frequent urination urination at night and hematuria Abdominal: negative for nausea, vomiting, diarrhea, bright red blood per rectum, melena, or hematemesis Neurologic: negative for visual changes, and/or hearing changes  All other systems reviewed and are otherwise  negative except as noted above.  Labs: No results for input(s): CKTOTAL, CKMB, TROPONINI in the last 72 hours. Lab Results  Component Value Date   WBC 11.1 (H) 04/28/2021   HGB 10.3 (L) 04/28/2021   HCT 33.6 (L) 04/28/2021   MCV 93.1 04/28/2021   PLT 262 04/28/2021    Recent Labs  Lab 04/28/21 0445  NA 136  K 4.3  CL 101  CO2 31  BUN 20  CREATININE 0.69  CALCIUM 8.4*  GLUCOSE 230*   No results found for: CHOL, HDL, LDLCALC, TRIG No results found for: DDIMER  Radiology/Studies:  DG Chest 2 View  Result Date: 04/27/2021 CLINICAL DATA:  Provided history: Shortness of breath, hypoxia. EXAM: CHEST - 2 VIEW COMPARISON:  No pertinent prior exams available for comparison. FINDINGS: Heart size within normal limits. Elevation of the right hemidiaphragm. Opacity within the adjacent right lung base. No appreciable airspace consolidation within the left lung. No sizable pleural effusion or evidence of pneumothorax. No acute bony abnormality identified. IMPRESSION: Elevation of the right hemidiaphragm. Opacity within the adjacent right lung base, which may reflect atelectasis and/or pneumonia. Electronically Signed   By: Kellie Simmering D.O.   On: 04/27/2021 14:16   CT Angio Chest Pulmonary Embolism (PE) W or WO Contrast  Result Date: 04/27/2021 CLINICAL DATA:  Pulmonary embolism (PE) suspected, high prob Pneumonia due to COVID-19 virus. EXAM: CT ANGIOGRAPHY CHEST WITH CONTRAST TECHNIQUE: Multidetector CT imaging of the chest was performed using the standard protocol during bolus administration of intravenous contrast. Multiplanar CT image reconstructions and MIPs were obtained to evaluate the vascular anatomy. RADIATION DOSE REDUCTION: This exam was performed according to the departmental dose-optimization program which includes automated exposure control, adjustment of the mA and/or kV according to patient size and/or use of iterative reconstruction technique. CONTRAST:  127mL OMNIPAQUE IOHEXOL  350 MG/ML SOLN COMPARISON:  Radiograph earlier today FINDINGS: Cardiovascular: There are no filling defects within the pulmonary arteries to suggest pulmonary embolus. Subsegmental branches particularly at the lung bases are not well assessed due to contrast bolus timing and breathing motion artifact. The main pulmonary artery is dilated at 4.3 cm. Upper normal heart size. Thoracic aorta is normal in caliber. Minimal aortic atherosclerosis. No aortic dissection. No pericardial effusion. Mediastinum/Nodes: Shotty mediastinal and bilateral hilar lymph nodes. All lymph nodes are less than 10 mm short axis. Tiny hiatal hernia. Lungs/Pleura: Eventration of the right hemidiaphragm. Adjacent peri diaphragmatic opacity in the right lower and middle lobes typical of compressive atelectasis. There is heterogeneous pulmonary parenchyma. There are scattered small pulmonary nodules within both lungs. Largest nodule measures 4 mm in the subpleural left lower lobe, series 3, image 50. Please note that accurate assessment and measurement of these nodules is partially limited due to breathing motion artifact. No pleural fluid. Upper Abdomen: Suspected hepatic steatosis. No acute upper abdominal findings. Musculoskeletal: Thoracic spondylosis with diffuse anterior spurring. There are no acute or suspicious osseous abnormalities. No chest wall soft tissue abnormalities. Review of the MIP images confirms the above findings. IMPRESSION: 1. No pulmonary embolus, technical limitations due to soft tissue attenuation and breathing motion artifact. 2. Eventration of the right hemidiaphragm with adjacent peri diaphragmatic opacity typical of compressive atelectasis. 3. Heterogeneous pulmonary parenchyma, can be seen with small vessel or small airways disease. 4. Scattered small pulmonary nodules within both lungs. Largest nodule measures 4 mm in the subpleural left lower lobe. Please note that accurate assessment and measurement of these  nodules is partially limited due to breathing motion artifact. No follow-up needed if patient is low-risk (and has no known or suspected primary neoplasm). Non-contrast chest CT can be considered in 12 months if patient is high-risk. This recommendation follows the consensus statement: Guidelines for Management of Incidental Pulmonary Nodules Detected on CT Images: From the Fleischner Society 2017; Radiology 2017; 284:228-243. 5. Shotty mediastinal and bilateral hilar lymph nodes are likely reactive. 6. Enlarged main pulmonary artery, can be seen with pulmonary arterial hypertension. Aortic Atherosclerosis (ICD10-I70.0). Electronically Signed   By: Keith Rake M.D.   On: 04/27/2021 17:35    EKG: Sinus tachycardia  Weights: Filed Weights   04/27/21 1347 04/27/21 1921  Weight: 135.6 kg (!) 162.3 kg     Physical Exam: Blood pressure 119/67, pulse 95,  temperature 98.5 F (36.9 C), resp. rate 20, height 5\' 5"  (1.651 m), weight (!) 162.3 kg, SpO2 99 %. Body mass index is 59.54 kg/m. General: Well developed, well nourished, in no acute distress. Head eyes ears nose throat: Normocephalic, atraumatic, sclera non-icteric, no xanthomas, nares are without discharge. No apparent thyromegaly and/or mass  Lungs: Normal respiratory effort.  no wheezes, no rales, no rhonchi.  Heart: RRR with normal S1 S2. no murmur gallop, no rub, PMI is normal size and placement, carotid upstroke normal without bruit, jugular venous pressure is normal Abdomen: Soft, non-tender, non-distended with normoactive bowel sounds. No hepatomegaly. No rebound/guarding. No obvious abdominal masses. Abdominal aorta is normal size without bruit Extremities: No edema. no cyanosis, no clubbing, no ulcers  Peripheral : 2+ bilateral upper extremity pulses, 2+ bilateral femoral pulses, 2+ bilateral dorsal pedal pulse Neuro: Alert and oriented. No facial asymmetry. No focal deficit. Moves all extremities spontaneously. Musculoskeletal:  Normal muscle tone without kyphosis Psych:  Responds to questions appropriately with a normal affect.    Assessment: 61 year old female with diabetes hypertension hyperlipidemia obesity with an acute onset Pneumonia Without current evidence of acute coronary syndrome and/or congestive heart failure  Plan: 1.  Continue supportive care for pneumonia respiratory distress and hypoxia likely causing majority of current symptoms 2.  Echocardiogram for LV systolic dysfunction valvular heart disease contributing to above 3.  No further cardiac intervention of elevated troponin most consistent with demand ischemia rather than acute coronary syndrome 4.  Continuation of medication management for other risk factors including diabetes hypertension hyperlipidemia as before Signed, Corey Skains M.D. [facility] 04/28/2021, 8:05 AM

## 2021-04-28 NOTE — Progress Notes (Signed)
Progress Note   Patient: Carolyn Levine AJO:[id number] DOB: 1960-11-09 DOA: 04/27/2021     1 DOS: the patient was seen and examined on 04/28/2021     Assessment and Plan: * Acute respiratory failure due to COVID-19 [facility])- (present on admission) Sepsis ruled out. Sent in for a pulse ox in the 70s at PCPs office.  Patient does have some nasal congestion and a little bit of a cough.  Currently on 3 L of oxygen.  Will check pulse ox on room air on a daily basis to see if we can get her off the oxygen.  (Likely will have to use ear or toenails because she has thickened nails on her fingers.)  Pneumonia due to COVID-19 virus- (present on admission) Discontinue antibiotics since procalcitonin negative.  Continue remdesivir and Decadron.  Type 2 diabetes mellitus with hyperlipidemia ([place]) Patient on Levemir insulin and sliding scale insulin at this point.  Watch sugars closely since on steroids.  Continue Crestor.  Elevated troponin- (present on admission) Likely secondary to acute hypoxic respiratory failure.  Cardiology recommended an echocardiogram.  Morbid obesity with BMI of 50.0-59.9, adult ([place]) BMI 59.54  Hypertension- (present on admission) Continue lisinopril.  Pulmonary nodules Patient will need a repeat CT scan in about 6 months for further evaluation.   Low a.m. cortisol cannot be interpreted because the patient received IV steroids prior to the blood draw.  IV steroids will artificially lower the a.m. cortisol.      Subjective: Patient went to her PCP and was found to have a low oxygen level.  She thought she had a sinus infection because he had nasal congestion.  Physical Exam: Vitals:   04/28/21 0055 04/28/21 0459 04/28/21 0740 04/28/21 1225  BP: (!) 148/72 (!) 123/57 119/67 (!) 152/67  Pulse: (!) 104 99 95 94  Resp:  18 20 18   Temp: 98.5 F (36.9 C) 97.9 F (36.6 C) 98.5 F (36.9 C) 97.7 F (36.5 C)  TempSrc: Oral Oral  Oral  SpO2: 98% 99% 99% 100%   Weight:      Height:       Physical Exam HENT:     Head: Normocephalic.     Mouth/Throat:     Pharynx: No oropharyngeal exudate.  Eyes:     General: Lids are normal.     Conjunctiva/sclera: Conjunctivae normal.  Cardiovascular:     Rate and Rhythm: Normal rate and regular rhythm.     Heart sounds: Normal heart sounds, S1 normal and S2 normal.  Pulmonary:     Breath sounds: Examination of the right-lower field reveals decreased breath sounds. Examination of the left-lower field reveals decreased breath sounds. Decreased breath sounds present. No wheezing, rhonchi or rales.  Abdominal:     Palpations: Abdomen is soft.     Tenderness: There is no abdominal tenderness.  Musculoskeletal:     Right lower leg: No swelling.     Left lower leg: No swelling.  Skin:    General: Skin is warm.     Findings: No rash.  Neurological:     Mental Status: She is alert and oriented to person, place, and time.     Data Reviewed: Laboratory data and radiological data reviewed.  With procalcitonin negative discontinue antibiotics.  Patient will need to follow-up for pulmonary nodules on CT scan.  Family Communication: Spoke with husband at the bedside  Disposition: Status is: Inpatient Remains inpatient appropriate because: I would like to get the patient off oxygen prior to disposition  Planned Discharge Destination: Home   Author: Loletha Grayer, MD 04/28/2021 12:32 PM  For on call review [email or web address].

## 2021-04-28 NOTE — TOC CM/SW Note (Signed)
°  Transition of Care (TOC) Screening Note   Patient Details  Name: Carolyn Levine Date of Birth: May 07, 1960   Transition of Care [facility]) CM/SW Contact:    Magnus Ivan, LCSW Phone Number: 04/28/2021, 8:49 AM    Transition of Care Department [facility]) has reviewed patient and no TOC needs have been identified at this time. We will continue to monitor patient advancement through interdisciplinary progression rounds. If new patient transition needs arise, please place a TOC consult.

## 2021-04-29 ENCOUNTER — Inpatient Hospital Stay
Admit: 2021-04-29 | Discharge: 2021-04-29 | Disposition: A | Discharge status: Home / Self Care | Payer: 59 | Attending: Internal Medicine | Admitting: Internal Medicine

## 2021-04-29 DIAGNOSIS — K5900 Constipation, unspecified: Secondary | ICD-10-CM

## 2021-04-29 DIAGNOSIS — R609 Edema, unspecified: Secondary | ICD-10-CM

## 2021-04-29 LAB — CBC
HCT: 34.3 % — ABNORMAL LOW (ref 36.0–46.0)
Hemoglobin: 10.6 g/dL — ABNORMAL LOW (ref 12.0–15.0)
MCH: 28.8 pg (ref 26.0–34.0)
MCHC: 30.9 g/dL (ref 30.0–36.0)
MCV: 93.2 fL (ref 80.0–100.0)
Platelets: 295 10*3/uL (ref 150–400)
RBC: 3.68 MIL/uL — ABNORMAL LOW (ref 3.87–5.11)
RDW: 13.9 % (ref 11.5–15.5)
WBC: 11.5 10*3/uL — ABNORMAL HIGH (ref 4.0–10.5)
nRBC: 0 % (ref 0.0–0.2)

## 2021-04-29 LAB — FERRITIN: Ferritin: 48 ng/mL (ref 11–307)

## 2021-04-29 LAB — GLUCOSE, CAPILLARY
Glucose-Capillary: 128 mg/dL — ABNORMAL HIGH (ref 70–99)
Glucose-Capillary: 141 mg/dL — ABNORMAL HIGH (ref 70–99)
Glucose-Capillary: 87 mg/dL (ref 70–99)
Glucose-Capillary: 175 mg/dL — ABNORMAL HIGH (ref 70–99)

## 2021-04-29 LAB — COMPREHENSIVE METABOLIC PANEL
ALT: 15 U/L (ref 0–44)
AST: 16 U/L (ref 15–41)
Albumin: 3.1 g/dL — ABNORMAL LOW (ref 3.5–5.0)
Alkaline Phosphatase: 52 U/L (ref 38–126)
Anion gap: 8 (ref 5–15)
BUN: 26 mg/dL — ABNORMAL HIGH (ref 8–23)
CO2: 30 mmol/L (ref 22–32)
Calcium: 8.6 mg/dL — ABNORMAL LOW (ref 8.9–10.3)
Chloride: 100 mmol/L (ref 98–111)
Creatinine, Ser: 0.66 mg/dL (ref 0.44–1.00)
GFR, Estimated: >60 mL/min (ref >60)
Glucose, Bld: 191 mg/dL — ABNORMAL HIGH (ref 70–99)
Potassium: 4.2 mmol/L (ref 3.5–5.1)
Sodium: 138 mmol/L (ref 135–145)
Total Bilirubin: 0.4 mg/dL (ref 0.3–1.2)
Total Protein: 6.8 g/dL (ref 6.5–8.1)

## 2021-04-29 LAB — C-REACTIVE PROTEIN: CRP: 4.6 mg/dL — ABNORMAL HIGH (ref <1.0)

## 2021-04-29 MED ORDER — FUROSEMIDE 10 MG/ML IJ SOLN
20.0000 mg | Freq: Once | INTRAMUSCULAR | Status: AC
  Administered 2021-04-29: 10:00:00 20 mg via INTRAVENOUS
  Filled 2021-04-29: qty 2

## 2021-04-29 MED ORDER — POLYETHYLENE GLYCOL 3350 17 G PO PACK
17.0000 g | PACK | Freq: Every day | ORAL | Status: DC
  Administered 2021-04-29: 10:00:00 17 g via ORAL
  Filled 2021-04-29 (×2): qty 1

## 2021-04-29 MED ORDER — ALBUTEROL SULFATE HFA 108 (90 BASE) MCG/ACT IN AERS
2.0000 | INHALATION_SPRAY | Freq: Three times a day (TID) | RESPIRATORY_TRACT | Status: DC
  Administered 2021-04-29 – 2021-04-30 (×4): 2 via RESPIRATORY_TRACT
  Filled 2021-04-29: qty 6.7

## 2021-04-29 NOTE — Assessment & Plan Note (Signed)
Added miralax

## 2021-04-29 NOTE — Progress Notes (Signed)
*  PRELIMINARY RESULTS* Echocardiogram 2D Echocardiogram has been performed.  Carolyn Levine Carolyn Levine 04/29/2021, 12:10 PM

## 2021-04-29 NOTE — Progress Notes (Signed)
°  Progress Note   Patient: Carolyn Levine [id number]:[id number] DOB: 12-27-1960 DOA: 04/27/2021     2 DOS: the patient was seen and examined on 04/29/2021   Assessment and Plan: * Acute respiratory failure due to COVID-19 [facility])- (present on admission) Sent in for a pulse ox in the 70s at PCPs office.  Today pulse ox of 83% at rest sitting in the chair on room air.  Unable to come off oxygen completely at this point.  Continue to check to see if we can get her off oxygen on a daily basis.  Pneumonia due to COVID-19 virus- (present on admission) Continue remdesivir and Decadron.  Type 2 diabetes mellitus with hyperlipidemia ([place]) Patient on Levemir insulin and sliding scale insulin at this point.  Watch sugars closely since on steroids.  Continue Crestor.  Elevated troponin- (present on admission) Likely secondary to acute hypoxic respiratory failure.  Echocardiogram done but not read yet.  Morbid obesity with BMI of 50.0-59.9, adult ([facility]) BMI 59.54  Hypertension- (present on admission) Continue lisinopril.  Constipation Added miralax  Edema 1 dose of Lasix for lower extremity edema.  Pulmonary nodules Patient will need a repeat CT scan in about 6 months for further evaluation.        Subjective: Patient feeling a little bit better today.  Still with some congestion in her sinuses.  Does have a little swelling of the lower extremity.  A bit constipated.  Admitted with acute hypoxic respiratory failure secondary to COVID-19 infection.  Physical Exam: Vitals:   04/29/21 0031 04/29/21 0602 04/29/21 0807 04/29/21 1214  BP: (!) 119/56 (!) 135/50 140/65 133/65  Pulse: 96 88 85 79  Resp: 20 20 18 18   Temp: 98.2 F (36.8 C) 97.9 F (36.6 C) 98.2 F (36.8 C) 98 F (36.7 C)  TempSrc: Oral Oral Oral Oral  SpO2: 97% 99% 100% 99%  Weight:      Height:       Physical Exam HENT:     Head: Normocephalic.     Mouth/Throat:     Pharynx: No oropharyngeal exudate.  Eyes:     General:  Lids are normal.     Conjunctiva/sclera: Conjunctivae normal.  Cardiovascular:     Rate and Rhythm: Normal rate and regular rhythm.     Heart sounds: Normal heart sounds, S1 normal and S2 normal.  Pulmonary:     Breath sounds: Examination of the right-lower field reveals decreased breath sounds. Examination of the left-lower field reveals decreased breath sounds. Decreased breath sounds present. No wheezing, rhonchi or rales.  Abdominal:     Palpations: Abdomen is soft.     Tenderness: There is no abdominal tenderness.  Musculoskeletal:     Right lower leg: Swelling present.     Left lower leg: Swelling present.  Skin:    General: Skin is warm.     Findings: No rash.  Neurological:     Mental Status: She is alert and oriented to person, place, and time.     Data Reviewed: White blood cell count 11.5, hemoglobin 10.6, CRP 4.6   Family Communication: Updated patient's husband on the phone  Disposition: Status is: Inpatient Remains inpatient appropriate because: Still unable to get off oxygen today.  Planned Discharge Destination: Home  Author: Loletha Grayer, MD 04/29/2021 2:00 PM  For on call review [email or web address].

## 2021-04-29 NOTE — Assessment & Plan Note (Signed)
1 dose of Lasix for lower extremity edema.

## 2021-04-30 ENCOUNTER — Other Ambulatory Visit: Payer: Self-pay

## 2021-04-30 DIAGNOSIS — H5789 Other specified disorders of eye and adnexa: Secondary | ICD-10-CM

## 2021-04-30 LAB — BLOOD GAS, ARTERIAL
Acid-Base Excess: 9.4 mmol/L — ABNORMAL HIGH (ref 0.0–2.0)
Bicarbonate: 34.8 mmol/L — ABNORMAL HIGH (ref 20.0–28.0)
O2 Content: 21 L/min
O2 Saturation: 94.1 %
Patient temperature: 37
pCO2 arterial: 49 mmHg — ABNORMAL HIGH (ref 32–48)
pH, Arterial: 7.46 — ABNORMAL HIGH (ref 7.35–7.45)
pO2, Arterial: 66 mmHg — ABNORMAL LOW (ref 83–108)

## 2021-04-30 LAB — ECHOCARDIOGRAM COMPLETE
AR max vel: 1.96 cm2
AV Peak grad: 11.8 mmHg
Ao pk vel: 1.72 m/s
Area-P 1/2: 5.06 cm2
Height: 65 in
S' Lateral: 3 cm
Weight: 5724.91 oz

## 2021-04-30 LAB — GLUCOSE, CAPILLARY
Glucose-Capillary: 113 mg/dL — ABNORMAL HIGH (ref 70–99)
Glucose-Capillary: 125 mg/dL — ABNORMAL HIGH (ref 70–99)
Glucose-Capillary: 132 mg/dL — ABNORMAL HIGH (ref 70–99)

## 2021-04-30 MED ORDER — ASCORBIC ACID 500 MG PO TABS
500.0000 mg | ORAL_TABLET | Freq: Every day | ORAL | 0 refills | Status: DC
  Filled 2021-04-30 – 2021-09-12 (×2): qty 30, 30d supply, fill #0

## 2021-04-30 MED ORDER — POLYETHYLENE GLYCOL 3350 17 G PO PACK
17.0000 g | PACK | Freq: Every day | ORAL | 0 refills | Status: DC | PRN
  Filled 2021-04-30: qty 30, 30d supply, fill #0

## 2021-04-30 MED ORDER — ALBUTEROL SULFATE HFA 108 (90 BASE) MCG/ACT IN AERS
2.0000 | INHALATION_SPRAY | Freq: Four times a day (QID) | RESPIRATORY_TRACT | 0 refills | Status: DC | PRN
  Filled 2021-04-30: qty 18, 25d supply, fill #0

## 2021-04-30 MED ORDER — ZINC SULFATE 220 (50 ZN) MG PO CAPS
220.0000 mg | ORAL_CAPSULE | Freq: Every day | ORAL | 0 refills | Status: DC
  Filled 2021-04-30: qty 30, 30d supply, fill #0

## 2021-04-30 MED ORDER — POLYVINYL ALCOHOL 1.4 % OP SOLN
2.0000 [drp] | OPHTHALMIC | 0 refills | Status: DC | PRN
  Filled 2021-04-30: qty 15, 13d supply, fill #0

## 2021-04-30 MED ORDER — PREDNISONE 10 MG PO TABS
ORAL_TABLET | ORAL | 0 refills | Status: DC
  Filled 2021-04-30: qty 12, 6d supply, fill #0

## 2021-04-30 NOTE — Assessment & Plan Note (Signed)
Advised not to rub the eye.  Not seeing any foreign body.  Natural tears.  Can follow-up with eye doctor if not better in a couple days

## 2021-04-30 NOTE — Discharge Summary (Signed)
Physician Discharge Summary   Patient: Carolyn Levine MRN: [id number] DOB: 03/30/1960  Admit date:     04/27/2021  Discharge date: 04/30/21  Discharge Physician: Loletha Grayer   PCP: [name], MD   Recommendations at discharge:   Follow-up PCP 1 week Follow-up Dr. Nehemiah Massed cardiology 1 week  Discharge Diagnoses: Principal Problem:   Acute respiratory failure due to COVID-19 [facility]) Active Problems:   Pneumonia due to COVID-19 virus   Type 2 diabetes mellitus with hyperlipidemia (HCC)   Elevated troponin   Morbid obesity with BMI of 50.0-59.9, adult (HCC)   Hypertension   Irritation of left eye   Sepsis due to pneumonia [facility])   Pulmonary nodules   Edema   Constipation    Hospital Course: The patient was admitted to the hospital on 04/27/2021 and discharged on 04/30/2021.  The patient was admitted with acute hypoxic respiratory failure secondary to COVID-19 pneumonia.  The patient was started on remdesivir and Decadron.  The patient received 4 days of remdesivir.  The patient will be discharged on a prednisone taper.  Upon discharge the patient was able to hold her saturations with ambulation and mostly in the 90s but did dip down to 89% briefly.  The patient does not qualify for home oxygen.  The patient's procalcitonin was negative so antibiotics were discontinued.  The patient was given Levemir insulin while here in the hospital while on steroids.  Can go back on her oral medications upon discharge.  Assessment and Plan: * Acute respiratory failure due to COVID-19 [facility])- (present on admission) Sent in for a pulse ox in the 70s at PCPs office.  Pulse ox 83% yesterday.  With ambulation the patient was able to hold her sats in the 90s for the most part and dropped down to 88 for a brief period of time.  Patient does not qualify for oxygen at home.  Patient received 4 days of remdesivir.  We will give prednisone taper upon disposition.  Pneumonia due to COVID-19 virus- (present  on admission) Received 4 days of remdesivir here.  We will give prednisone taper upon disposition.  Type 2 diabetes mellitus with hyperlipidemia ([place]) Can go back home on oral medications.  Sugars may be high for the next couple days while on steroids  Elevated troponin- (present on admission) Likely secondary to acute hypoxic respiratory failure.  Echocardiogram showed a normal ejection fraction and mild mitral regurgitation and mild to moderate tricuspid regurgitation.  Morbid obesity with BMI of 50.0-59.9, adult (HCC) BMI 59.54  Hypertension- (present on admission) Continue lisinopril.  Irritation of left eye Advised not to rub the eye.  Not seeing any foreign body.  Natural tears.  Can follow-up with eye doctor if not better in a couple days  Constipation Added miralax  Edema 1 dose of Lasix for lower extremity edema.  Pulmonary nodules Patient will need a repeat CT scan in about 6 months for further evaluation.           Consultants: Cardiology Procedures performed: None Disposition: Home Diet recommendation:  Cardiac and Carb modified diet  DISCHARGE MEDICATION: Allergies as of 04/30/2021       Reactions   Codeine Sulfate    Simvastatin    Other reaction(s): Muscle Pain   Sulphadimidine [sulfamethazine]         Medication List     STOP taking these medications    Fluarix Quadrivalent 0.5 ML injection Generic drug: influenza vac split quadrivalent PF   Pfizer-BioNT COVID-19 Vac-TriS Susp injection  Generic drug: COVID-19 mRNA Vac-TriS Therapist, music)       TAKE these medications    albuterol 108 (90 Base) MCG/ACT inhaler Commonly known as: VENTOLIN HFA Inhale 2 puffs into the lungs every 6 (six) hours as needed for wheezing or shortness of breath. What changed: when to take this   ascorbic acid 500 MG tablet Commonly known as: VITAMIN C Take 1 tablet (500 mg total) by mouth daily.   guaiFENesin-dextromethorphan 100-10 MG/5ML syrup Commonly  known as: ROBITUSSIN DM Take 5 mLs by mouth every 4 (four) hours as needed for cough.   lisinopril 10 MG tablet Commonly known as: ZESTRIL TAKE 1 TABLET BY MOUTH ONCE DAILY What changed: Another medication with the same name was removed. Continue taking this medication, and follow the directions you see here.   metFORMIN 1000 MG tablet Commonly known as: GLUCOPHAGE Take 1,000 mg by mouth 2 (two) times daily with a meal. What changed: Another medication with the same name was removed. Continue taking this medication, and follow the directions you see here.   pioglitazone 30 MG tablet Commonly known as: ACTOS TAKE 1 TABLET BY MOUTH ONCE DAILY   polyethylene glycol 17 g packet Commonly known as: MIRALAX / GLYCOLAX Take 17 g by mouth daily as needed.   polyvinyl alcohol 1.4 % ophthalmic solution Commonly known as: LIQUIFILM TEARS Place 2 drops into the left eye every 2 (two) hours as needed for dry eyes.   predniSONE 10 MG tablet Commonly known as: DELTASONE Take 3 tablets by mouth for 2 days, then take 2 tablets for 2 days, then 1 tablet for 2 days (3 tabs po day 1,2;  2 tabs po day3,4;  1 tab po day5,6) What changed: additional instructions   rosuvastatin 5 MG tablet Commonly known as: CRESTOR Take 5 mg by mouth daily. Takes every three days   traZODone 50 MG tablet Commonly known as: DESYREL Take 1 tablet (50 mg total) by mouth at bedtime What changed: Another medication with the same name was removed. Continue taking this medication, and follow the directions you see here.   zinc sulfate 220 (50 Zn) MG capsule Take 1 capsule (220 mg total) by mouth daily. Start taking on: May 01, 2021        Follow-up Information     Corey Skains, MD. Go on 05/29/2021.   Specialty: Cardiology Why: @9 :30am Contact information: [address] [facility] [place] [address] [phone]         [name], MD. Go on 05/10/2021.    Specialty: Family Medicine Why: @10 :30am Contact information: [place] [address] [phone]                 Discharge Exam: Danley Danker Weights   04/27/21 1347 04/27/21 1921  Weight: 135.6 kg (!) 162.3 kg   Physical Exam HENT:     Head: Normocephalic.     Mouth/Throat:     Pharynx: No oropharyngeal exudate.  Eyes:     General: Lids are normal.     Conjunctiva/sclera: Conjunctivae normal.  Cardiovascular:     Rate and Rhythm: Normal rate and regular rhythm.     Heart sounds: Normal heart sounds, S1 normal and S2 normal.  Pulmonary:     Breath sounds: Examination of the right-lower field reveals decreased breath sounds. Examination of the left-lower field reveals decreased breath sounds. Decreased breath sounds present. No wheezing, rhonchi or rales.  Abdominal:     Palpations: Abdomen is soft.  Tenderness: There is no abdominal tenderness.  Musculoskeletal:     Right lower leg: Swelling present.     Left lower leg: Swelling present.  Skin:    General: Skin is warm.     Findings: No rash.  Neurological:     Mental Status: She is alert and oriented to person, place, and time.     Condition at discharge: stable  The results of significant diagnostics from this hospitalization (including imaging, microbiology, ancillary and laboratory) are listed below for reference.   Imaging Studies: DG Chest 2 View  Result Date: 04/27/2021 CLINICAL DATA:  Provided history: Shortness of breath, hypoxia. EXAM: CHEST - 2 VIEW COMPARISON:  No pertinent prior exams available for comparison. FINDINGS: Heart size within normal limits. Elevation of the right hemidiaphragm. Opacity within the adjacent right lung base. No appreciable airspace consolidation within the left lung. No sizable pleural effusion or evidence of pneumothorax. No acute bony abnormality identified. IMPRESSION: Elevation of the right hemidiaphragm. Opacity within the adjacent right lung base, which may  reflect atelectasis and/or pneumonia. Electronically Signed   By: Kellie Simmering D.O.   On: 04/27/2021 14:16   CT Angio Chest Pulmonary Embolism (PE) W or WO Contrast  Result Date: 04/27/2021 CLINICAL DATA:  Pulmonary embolism (PE) suspected, high prob Pneumonia due to COVID-19 virus. EXAM: CT ANGIOGRAPHY CHEST WITH CONTRAST TECHNIQUE: Multidetector CT imaging of the chest was performed using the standard protocol during bolus administration of intravenous contrast. Multiplanar CT image reconstructions and MIPs were obtained to evaluate the vascular anatomy. RADIATION DOSE REDUCTION: This exam was performed according to the departmental dose-optimization program which includes automated exposure control, adjustment of the mA and/or kV according to patient size and/or use of iterative reconstruction technique. CONTRAST:  132mL OMNIPAQUE IOHEXOL 350 MG/ML SOLN COMPARISON:  Radiograph earlier today FINDINGS: Cardiovascular: There are no filling defects within the pulmonary arteries to suggest pulmonary embolus. Subsegmental branches particularly at the lung bases are not well assessed due to contrast bolus timing and breathing motion artifact. The main pulmonary artery is dilated at 4.3 cm. Upper normal heart size. Thoracic aorta is normal in caliber. Minimal aortic atherosclerosis. No aortic dissection. No pericardial effusion. Mediastinum/Nodes: Shotty mediastinal and bilateral hilar lymph nodes. All lymph nodes are less than 10 mm short axis. Tiny hiatal hernia. Lungs/Pleura: Eventration of the right hemidiaphragm. Adjacent peri diaphragmatic opacity in the right lower and middle lobes typical of compressive atelectasis. There is heterogeneous pulmonary parenchyma. There are scattered small pulmonary nodules within both lungs. Largest nodule measures 4 mm in the subpleural left lower lobe, series 3, image 50. Please note that accurate assessment and measurement of these nodules is partially limited due to  breathing motion artifact. No pleural fluid. Upper Abdomen: Suspected hepatic steatosis. No acute upper abdominal findings. Musculoskeletal: Thoracic spondylosis with diffuse anterior spurring. There are no acute or suspicious osseous abnormalities. No chest wall soft tissue abnormalities. Review of the MIP images confirms the above findings. IMPRESSION: 1. No pulmonary embolus, technical limitations due to soft tissue attenuation and breathing motion artifact. 2. Eventration of the right hemidiaphragm with adjacent peri diaphragmatic opacity typical of compressive atelectasis. 3. Heterogeneous pulmonary parenchyma, can be seen with small vessel or small airways disease. 4. Scattered small pulmonary nodules within both lungs. Largest nodule measures 4 mm in the subpleural left lower lobe. Please note that accurate assessment and measurement of these nodules is partially limited due to breathing motion artifact. No follow-up needed if patient is low-risk (and has no known  or suspected primary neoplasm). Non-contrast chest CT can be considered in 12 months if patient is high-risk. This recommendation follows the consensus statement: Guidelines for Management of Incidental Pulmonary Nodules Detected on CT Images: From the Fleischner Society 2017; Radiology 2017; 284:228-243. 5. Shotty mediastinal and bilateral hilar lymph nodes are likely reactive. 6. Enlarged main pulmonary artery, can be seen with pulmonary arterial hypertension. Aortic Atherosclerosis (ICD10-I70.0). Electronically Signed   By: Keith Rake M.D.   On: 04/27/2021 17:35   US Venous Img Lower Bilateral (DVT)  Result Date: 04/28/2021 CLINICAL DATA:  Lower extremity edema.  COVID positive. EXAM: BILATERAL LOWER EXTREMITY VENOUS DOPPLER ULTRASOUND TECHNIQUE: Gray-scale sonography with graded compression, as well as color Doppler and duplex ultrasound were performed to evaluate the lower extremity deep venous systems from the level of the common  femoral vein and including the common femoral, femoral, profunda femoral, popliteal and calf veins including the posterior tibial, peroneal and gastrocnemius veins when visible. The superficial great saphenous vein was also interrogated. Spectral Doppler was utilized to evaluate flow at rest and with distal augmentation maneuvers in the common femoral, femoral and popliteal veins. COMPARISON:  None. FINDINGS: RIGHT LOWER EXTREMITY Common Femoral Vein: No evidence of thrombus. Normal compressibility, respiratory phasicity and response to augmentation. Saphenofemoral Junction: No evidence of thrombus. Normal compressibility and flow on color Doppler imaging. Profunda Femoral Vein: No evidence of thrombus. Normal compressibility and flow on color Doppler imaging. Femoral Vein: No evidence of thrombus. Normal compressibility, respiratory phasicity and response to augmentation. Popliteal Vein: No evidence of thrombus. Normal compressibility, respiratory phasicity and response to augmentation. Calf Veins: No evidence of thrombus. Normal compressibility and flow on color Doppler imaging. Superficial Great Saphenous Vein: No evidence of thrombus. Normal compressibility. Venous Reflux:  None. Other Findings:  None. LEFT LOWER EXTREMITY Common Femoral Vein: No evidence of thrombus. Normal compressibility, respiratory phasicity and response to augmentation. Saphenofemoral Junction: No evidence of thrombus. Normal compressibility and flow on color Doppler imaging. Profunda Femoral Vein: No evidence of thrombus. Normal compressibility and flow on color Doppler imaging. Femoral Vein: No evidence of thrombus. Normal compressibility, respiratory phasicity and response to augmentation. Popliteal Vein: No evidence of thrombus. Normal compressibility, respiratory phasicity and response to augmentation. Calf Veins: No evidence of thrombus. Normal compressibility and flow on color Doppler imaging. Superficial Great Saphenous Vein: No  evidence of thrombus. Normal compressibility. Venous Reflux:  None. Other Findings:  None. IMPRESSION: No evidence of deep venous thrombosis in either lower extremity. Electronically Signed   By: Kerby Moors M.D.   On: 04/28/2021 08:45   ECHOCARDIOGRAM COMPLETE  Result Date: 04/30/2021    ECHOCARDIOGRAM REPORT   Patient Name:   Carolyn Levine Date of Exam: 04/29/2021 Medical Rec #:  [id number]       Height:       65.0 in Accession #:    [id number]      Weight:       357.8 lb Date of Birth:  September 30, 1960       BSA:          2.533 m Patient Age:    61 years        BP:           119/67 mmHg Patient Gender: F               HR:           89 bpm. Exam Location:  [facility] Procedure: 2D Echo, Color Doppler and Cardiac Doppler Indications:  Elevated Troponin  History:         Patient has no prior history of Echocardiogram examinations.                  Signs/Symptoms:cancer; Risk Factors:Diabetes and Hypertension.  Sonographer:     Alyse Low Roar Referring Phys:  Cecilia Diagnosing Phys: Serafina Royals MD  Sonographer Comments: Patient is morbidly obese. IMPRESSIONS  1. Left ventricular ejection fraction, by estimation, is 60 to 65%. The left ventricle has normal function. The left ventricle has no regional wall motion abnormalities. Left ventricular diastolic parameters were normal.  2. Right ventricular systolic function is normal. The right ventricular size is normal. There is mildly elevated pulmonary artery systolic pressure.  3. The mitral valve is normal in structure. Mild mitral valve regurgitation.  4. Tricuspid valve regurgitation is mild to moderate.  5. The aortic valve is normal in structure. Aortic valve regurgitation is not visualized. FINDINGS  Left Ventricle: Left ventricular ejection fraction, by estimation, is 60 to 65%. The left ventricle has normal function. The left ventricle has no regional wall motion abnormalities. The left ventricular internal cavity size was normal in size. There is   no left ventricular hypertrophy. Left ventricular diastolic parameters were normal. Right Ventricle: The right ventricular size is normal. No increase in right ventricular wall thickness. Right ventricular systolic function is normal. There is mildly elevated pulmonary artery systolic pressure. Left Atrium: Left atrial size was normal in size. Right Atrium: Right atrial size was normal in size. Pericardium: There is no evidence of pericardial effusion. Mitral Valve: The mitral valve is normal in structure. Mild mitral valve regurgitation. Tricuspid Valve: The tricuspid valve is normal in structure. Tricuspid valve regurgitation is mild to moderate. Aortic Valve: The aortic valve is normal in structure. Aortic valve regurgitation is not visualized. Aortic valve peak gradient measures 11.8 mmHg. Pulmonic Valve: The pulmonic valve was normal in structure. Pulmonic valve regurgitation is trivial. Aorta: The aortic root and ascending aorta are structurally normal, with no evidence of dilitation. IAS/Shunts: No atrial level shunt detected by color flow Doppler.  LEFT VENTRICLE PLAX 2D LVIDd:         4.38 cm   Diastology LVIDs:         3.00 cm   LV e' medial:    6.20 cm/s LV PW:         1.21 cm   LV E/e' medial:  20.5 LV IVS:        1.32 cm   LV e' lateral:   9.25 cm/s LVOT diam:     1.90 cm   LV E/e' lateral: 13.7 LVOT Area:     2.84 cm  RIGHT VENTRICLE RV Mid diam:    3.00 cm RV S prime:     10.00 cm/s TAPSE (M-mode): 2.5 cm LEFT ATRIUM             Index        RIGHT ATRIUM           Index LA diam:        3.40 cm 1.34 cm/m   RA Area:     11.70 cm LA Vol (A2C):   50.9 ml 20.10 ml/m  RA Volume:   21.90 ml  8.65 ml/m LA Vol (A4C):   33.1 ml 13.07 ml/m LA Biplane Vol: 41.8 ml 16.50 ml/m  AORTIC VALVE                 PULMONIC VALVE AV Area (  Vmax): 1.96 cm     PV Vmax:        1.00 m/s AV Vmax:        172.00 cm/s  PV Peak grad:   4.0 mmHg AV Peak Grad:   11.8 mmHg    RVOT Peak grad: 2 mmHg LVOT Vmax:      119.00 cm/s   AORTA Ao Root diam: 2.50 cm Ao Asc diam:  3.00 cm MITRAL VALVE                TRICUSPID VALVE MV Area (PHT): 5.06 cm     TR Peak grad:   39.4 mmHg MV Decel Time: 150 msec     TR Vmax:        314.00 cm/s MV E velocity: 127.00 cm/s MV A velocity: 105.00 cm/s  SHUNTS MV E/A ratio:  1.21         Systemic Diam: 1.90 cm MV A Prime:    14.8 cm/s Serafina Royals MD Electronically signed by Serafina Royals MD Signature Date/Time: 04/30/2021/7:39:45 AM    Final     Microbiology: Results for orders placed or performed during the hospital encounter of 04/27/21  Resp Panel by RT-PCR (Flu A&B, Covid) Nasopharyngeal Swab     Status: Abnormal   Collection Time: 04/27/21  1:51 PM   Specimen: Nasopharyngeal Swab; Nasopharyngeal(NP) swabs in vial transport medium  Result Value Ref Range Status   SARS Coronavirus 2 by RT PCR POSITIVE (A) NEGATIVE Final    Comment: (NOTE) SARS-CoV-2 target nucleic acids are DETECTED.  The SARS-CoV-2 RNA is generally detectable in upper respiratory specimens during the acute phase of infection. Positive results are indicative of the presence of the identified virus, but do not rule out bacterial infection or co-infection with other pathogens not detected by the test. Clinical correlation with patient history and other diagnostic information is necessary to determine patient infection status. The expected result is Negative.  Fact Sheet for Patients: EntrepreneurPulse.com.au  Fact Sheet for Healthcare Providers: IncredibleEmployment.be  This test is not yet approved or cleared by the [place] FDA and  has been authorized for detection and/or diagnosis of SARS-CoV-2 by FDA under an Emergency Use Authorization (EUA).  This EUA will remain in effect (meaning this test can be used) for the duration of  the COVID-19 declaration under Section 564(b)(1) of the A ct, 21 U.S.C. section 360bbb-3(b)(1), unless the authorization is terminated or  revoked sooner.     Influenza A by PCR NEGATIVE NEGATIVE Final   Influenza B by PCR NEGATIVE NEGATIVE Final    Comment: (NOTE) The Xpert Xpress SARS-CoV-2/FLU/RSV plus assay is intended as an aid in the diagnosis of influenza from Nasopharyngeal swab specimens and should not be used as a sole basis for treatment. Nasal washings and aspirates are unacceptable for Xpert Xpress SARS-CoV-2/FLU/RSV testing.  Fact Sheet for Patients: EntrepreneurPulse.com.au  Fact Sheet for Healthcare Providers: IncredibleEmployment.be  This test is not yet approved or cleared by the [place] FDA and has been authorized for detection and/or diagnosis of SARS-CoV-2 by FDA under an Emergency Use Authorization (EUA). This EUA will remain in effect (meaning this test can be used) for the duration of the COVID-19 declaration under Section 564(b)(1) of the Act, 21 U.S.C. section 360bbb-3(b)(1), unless the authorization is terminated or revoked.  Performed at [facility], [address] [place], [place] [address]   Blood culture (routine x 2)     Status: None (Preliminary result)   Collection Time: 04/27/21  4:14 PM   Specimen: BLOOD  Result Value Ref Range Status   Specimen Description BLOOD RAC  Final   Special Requests BOTTLES DRAWN AEROBIC AND ANAEROBIC [place]  Final   Culture   Final    NO GROWTH 3 DAYS Performed at [facility], [address] [place], [place] [address]    Report Status PENDING  Incomplete  Blood culture (routine x 2)     Status: None (Preliminary result)   Collection Time: 04/27/21  4:14 PM   Specimen: BLOOD  Result Value Ref Range Status   Specimen Description BLOOD LAC  Final   Special Requests BOTTLES DRAWN AEROBIC AND ANAEROBIC BCAV  Final   Culture   Final    NO GROWTH 3 DAYS Performed at [facility], [address] [place], [place] [address]    Report Status PENDING  Incomplete     Labs: CBC: Recent Labs  Lab 04/27/21 1351 04/28/21 0445 04/29/21 0420  WBC 12.3* 11.1* 11.5*  HGB 11.3* 10.3* 10.6*  HCT 37.2 33.6* 34.3*  MCV 93.0 93.1 93.2  PLT 272 262 407   Basic Metabolic Panel: Recent Labs  Lab 04/27/21 1351 04/28/21 0445 04/29/21 0420  NA 135 136 138  K 4.0 4.3 4.2  CL 101 101 100  CO2 29 31 30   GLUCOSE 169* 230* 191*  BUN 20 20 26*  CREATININE 0.78 0.69 0.66  CALCIUM 8.5* 8.4* 8.6*   Liver Function Tests: Recent Labs  Lab 04/29/21 0420  AST 16  ALT 15  ALKPHOS 52  BILITOT 0.4  PROT 6.8  ALBUMIN 3.1*   CBG: Recent Labs  Lab 04/29/21 1631 04/29/21 2115 04/30/21 0828 04/30/21 1209 04/30/21 1550  GLUCAP 87 175* 113* 125* 132*    Discharge time spent: greater than 30 minutes.  Signed: Loletha Grayer, MD [facility] 04/30/2021

## 2021-05-02 LAB — CULTURE, BLOOD (ROUTINE X 2)
Culture: NO GROWTH
Culture: NO GROWTH

## 2021-05-10 ENCOUNTER — Other Ambulatory Visit: Payer: Self-pay

## 2021-05-10 DIAGNOSIS — E785 Hyperlipidemia, unspecified: Secondary | ICD-10-CM | POA: Diagnosis not present

## 2021-05-10 DIAGNOSIS — E119 Type 2 diabetes mellitus without complications: Secondary | ICD-10-CM | POA: Diagnosis not present

## 2021-05-10 DIAGNOSIS — I10 Essential (primary) hypertension: Secondary | ICD-10-CM | POA: Diagnosis not present

## 2021-05-10 DIAGNOSIS — Z8701 Personal history of pneumonia (recurrent): Secondary | ICD-10-CM | POA: Diagnosis not present

## 2021-05-10 MED ORDER — HYDROCOD POLI-CHLORPHE POLI ER 10-8 MG/5ML PO SUER
ORAL | 0 refills | Status: DC
  Filled 2021-05-10: qty 115, 12d supply, fill #0

## 2021-05-23 ENCOUNTER — Other Ambulatory Visit: Payer: Self-pay

## 2021-05-29 ENCOUNTER — Other Ambulatory Visit: Payer: Self-pay

## 2021-05-29 DIAGNOSIS — R778 Other specified abnormalities of plasma proteins: Secondary | ICD-10-CM | POA: Diagnosis not present

## 2021-05-29 DIAGNOSIS — R0602 Shortness of breath: Secondary | ICD-10-CM | POA: Diagnosis not present

## 2021-05-29 DIAGNOSIS — E782 Mixed hyperlipidemia: Secondary | ICD-10-CM | POA: Diagnosis not present

## 2021-05-29 DIAGNOSIS — I700 Atherosclerosis of aorta: Secondary | ICD-10-CM | POA: Insufficient documentation

## 2021-05-29 DIAGNOSIS — I10 Essential (primary) hypertension: Secondary | ICD-10-CM | POA: Diagnosis not present

## 2021-05-29 MED ORDER — ROSUVASTATIN CALCIUM 5 MG PO TABS
ORAL_TABLET | ORAL | 3 refills | Status: AC
  Filled 2021-05-29: qty 90, 90d supply, fill #0
  Filled 2021-09-12: qty 90, 90d supply, fill #1

## 2021-05-29 MED ORDER — LISINOPRIL 20 MG PO TABS
ORAL_TABLET | ORAL | 4 refills | Status: DC
  Filled 2021-05-29: qty 90, 90d supply, fill #0
  Filled 2021-09-12: qty 90, 90d supply, fill #1

## 2021-06-04 DIAGNOSIS — H40003 Preglaucoma, unspecified, bilateral: Secondary | ICD-10-CM | POA: Diagnosis not present

## 2021-06-18 ENCOUNTER — Other Ambulatory Visit
Admission: RE | Admit: 2021-06-18 | Discharge: 2021-06-18 | Disposition: A | Discharge status: Home / Self Care | Payer: 59 | Source: Ambulatory Visit | Attending: Specialist | Admitting: Specialist

## 2021-06-18 DIAGNOSIS — I272 Pulmonary hypertension, unspecified: Secondary | ICD-10-CM | POA: Insufficient documentation

## 2021-06-18 LAB — BRAIN NATRIURETIC PEPTIDE: B Natriuretic Peptide: 9.8 pg/mL (ref 0.0–100.0)

## 2021-06-26 DIAGNOSIS — R0600 Dyspnea, unspecified: Secondary | ICD-10-CM | POA: Diagnosis not present

## 2021-06-26 DIAGNOSIS — E119 Type 2 diabetes mellitus without complications: Secondary | ICD-10-CM | POA: Diagnosis not present

## 2021-06-26 DIAGNOSIS — I10 Essential (primary) hypertension: Secondary | ICD-10-CM | POA: Diagnosis not present

## 2021-07-09 ENCOUNTER — Other Ambulatory Visit: Payer: Self-pay

## 2021-07-10 DIAGNOSIS — G4733 Obstructive sleep apnea (adult) (pediatric): Secondary | ICD-10-CM | POA: Diagnosis not present

## 2021-07-18 DIAGNOSIS — G4733 Obstructive sleep apnea (adult) (pediatric): Secondary | ICD-10-CM | POA: Diagnosis not present

## 2021-07-18 DIAGNOSIS — I10 Essential (primary) hypertension: Secondary | ICD-10-CM | POA: Diagnosis not present

## 2021-07-18 DIAGNOSIS — R918 Other nonspecific abnormal finding of lung field: Secondary | ICD-10-CM | POA: Diagnosis not present

## 2021-07-18 DIAGNOSIS — R0609 Other forms of dyspnea: Secondary | ICD-10-CM | POA: Diagnosis not present

## 2021-07-30 DIAGNOSIS — Z8616 Personal history of COVID-19: Secondary | ICD-10-CM | POA: Diagnosis not present

## 2021-07-30 DIAGNOSIS — R0602 Shortness of breath: Secondary | ICD-10-CM | POA: Diagnosis not present

## 2021-07-30 DIAGNOSIS — R768 Other specified abnormal immunological findings in serum: Secondary | ICD-10-CM | POA: Diagnosis not present

## 2021-07-31 DIAGNOSIS — G4733 Obstructive sleep apnea (adult) (pediatric): Secondary | ICD-10-CM | POA: Diagnosis not present

## 2021-08-01 DIAGNOSIS — I700 Atherosclerosis of aorta: Secondary | ICD-10-CM | POA: Diagnosis not present

## 2021-08-01 DIAGNOSIS — E782 Mixed hyperlipidemia: Secondary | ICD-10-CM | POA: Diagnosis not present

## 2021-08-01 DIAGNOSIS — I10 Essential (primary) hypertension: Secondary | ICD-10-CM | POA: Diagnosis not present

## 2021-08-01 DIAGNOSIS — R778 Other specified abnormalities of plasma proteins: Secondary | ICD-10-CM | POA: Diagnosis not present

## 2021-08-15 ENCOUNTER — Other Ambulatory Visit: Payer: Self-pay

## 2021-08-31 DIAGNOSIS — G4733 Obstructive sleep apnea (adult) (pediatric): Secondary | ICD-10-CM | POA: Diagnosis not present

## 2021-09-12 ENCOUNTER — Other Ambulatory Visit: Payer: Self-pay

## 2021-09-30 DIAGNOSIS — G4733 Obstructive sleep apnea (adult) (pediatric): Secondary | ICD-10-CM | POA: Diagnosis not present

## 2021-10-03 DIAGNOSIS — R0600 Dyspnea, unspecified: Secondary | ICD-10-CM | POA: Diagnosis not present

## 2021-10-03 DIAGNOSIS — R918 Other nonspecific abnormal finding of lung field: Secondary | ICD-10-CM | POA: Diagnosis not present

## 2021-10-03 DIAGNOSIS — Z9989 Dependence on other enabling machines and devices: Secondary | ICD-10-CM | POA: Diagnosis not present

## 2021-10-03 DIAGNOSIS — G4733 Obstructive sleep apnea (adult) (pediatric): Secondary | ICD-10-CM | POA: Diagnosis not present

## 2021-10-12 DIAGNOSIS — Z0000 Encounter for general adult medical examination without abnormal findings: Secondary | ICD-10-CM | POA: Diagnosis not present

## 2021-10-12 DIAGNOSIS — E119 Type 2 diabetes mellitus without complications: Secondary | ICD-10-CM | POA: Diagnosis not present

## 2021-10-15 ENCOUNTER — Other Ambulatory Visit: Payer: Self-pay

## 2021-10-15 MED ORDER — PIOGLITAZONE HCL 30 MG PO TABS
ORAL_TABLET | Freq: Every day | ORAL | 3 refills | Status: DC
  Filled 2021-10-15: qty 90, 90d supply, fill #0

## 2021-10-16 ENCOUNTER — Other Ambulatory Visit: Payer: Self-pay

## 2021-10-16 DIAGNOSIS — E119 Type 2 diabetes mellitus without complications: Secondary | ICD-10-CM | POA: Diagnosis not present

## 2021-10-16 DIAGNOSIS — I10 Essential (primary) hypertension: Secondary | ICD-10-CM | POA: Diagnosis not present

## 2021-10-16 DIAGNOSIS — E782 Mixed hyperlipidemia: Secondary | ICD-10-CM | POA: Diagnosis not present

## 2021-10-16 DIAGNOSIS — G4733 Obstructive sleep apnea (adult) (pediatric): Secondary | ICD-10-CM | POA: Diagnosis not present

## 2021-10-16 DIAGNOSIS — R319 Hematuria, unspecified: Secondary | ICD-10-CM | POA: Diagnosis not present

## 2021-10-16 DIAGNOSIS — Z0000 Encounter for general adult medical examination without abnormal findings: Secondary | ICD-10-CM | POA: Diagnosis not present

## 2021-10-16 DIAGNOSIS — D649 Anemia, unspecified: Secondary | ICD-10-CM | POA: Diagnosis not present

## 2021-10-16 DIAGNOSIS — Z6841 Body Mass Index (BMI) 40.0 and over, adult: Secondary | ICD-10-CM | POA: Diagnosis not present

## 2021-10-16 DIAGNOSIS — Z9989 Dependence on other enabling machines and devices: Secondary | ICD-10-CM | POA: Diagnosis not present

## 2021-10-16 MED ORDER — PHENTERMINE HCL 37.5 MG PO TABS
ORAL_TABLET | ORAL | 3 refills | Status: DC
  Filled 2021-10-16: qty 30, 30d supply, fill #0

## 2021-10-17 ENCOUNTER — Other Ambulatory Visit: Payer: Self-pay | Admitting: Family Medicine

## 2021-10-17 DIAGNOSIS — Z1231 Encounter for screening mammogram for malignant neoplasm of breast: Secondary | ICD-10-CM

## 2021-10-25 DIAGNOSIS — G4733 Obstructive sleep apnea (adult) (pediatric): Secondary | ICD-10-CM | POA: Diagnosis not present

## 2021-10-31 DIAGNOSIS — G4733 Obstructive sleep apnea (adult) (pediatric): Secondary | ICD-10-CM | POA: Diagnosis not present

## 2021-11-02 ENCOUNTER — Emergency Department: Payer: 59

## 2021-11-02 ENCOUNTER — Inpatient Hospital Stay
Admission: EM | Admit: 2021-11-02 | Discharge: 2021-11-07 | DRG: 871 | Disposition: A | Discharge status: Home / Self Care | Payer: 59 | Attending: Internal Medicine | Admitting: Internal Medicine

## 2021-11-02 ENCOUNTER — Other Ambulatory Visit: Payer: Self-pay

## 2021-11-02 DIAGNOSIS — I5021 Acute systolic (congestive) heart failure: Secondary | ICD-10-CM

## 2021-11-02 DIAGNOSIS — I2694 Multiple subsegmental pulmonary emboli without acute cor pulmonale: Secondary | ICD-10-CM | POA: Diagnosis not present

## 2021-11-02 DIAGNOSIS — E785 Hyperlipidemia, unspecified: Secondary | ICD-10-CM | POA: Diagnosis present

## 2021-11-02 DIAGNOSIS — A4159 Other Gram-negative sepsis: Principal | ICD-10-CM | POA: Diagnosis present

## 2021-11-02 DIAGNOSIS — I214 Non-ST elevation (NSTEMI) myocardial infarction: Secondary | ICD-10-CM | POA: Diagnosis not present

## 2021-11-02 DIAGNOSIS — R000 Tachycardia, unspecified: Secondary | ICD-10-CM | POA: Diagnosis not present

## 2021-11-02 DIAGNOSIS — R6520 Severe sepsis without septic shock: Secondary | ICD-10-CM | POA: Diagnosis not present

## 2021-11-02 DIAGNOSIS — R531 Weakness: Secondary | ICD-10-CM | POA: Diagnosis not present

## 2021-11-02 DIAGNOSIS — I429 Cardiomyopathy, unspecified: Secondary | ICD-10-CM | POA: Diagnosis present

## 2021-11-02 DIAGNOSIS — N179 Acute kidney failure, unspecified: Secondary | ICD-10-CM

## 2021-11-02 DIAGNOSIS — G4733 Obstructive sleep apnea (adult) (pediatric): Secondary | ICD-10-CM | POA: Diagnosis present

## 2021-11-02 DIAGNOSIS — R059 Cough, unspecified: Secondary | ICD-10-CM | POA: Diagnosis not present

## 2021-11-02 DIAGNOSIS — E1169 Type 2 diabetes mellitus with other specified complication: Secondary | ICD-10-CM | POA: Diagnosis not present

## 2021-11-02 DIAGNOSIS — J9811 Atelectasis: Secondary | ICD-10-CM | POA: Diagnosis not present

## 2021-11-02 DIAGNOSIS — R7881 Bacteremia: Secondary | ICD-10-CM

## 2021-11-02 DIAGNOSIS — Z7984 Long term (current) use of oral hypoglycemic drugs: Secondary | ICD-10-CM | POA: Diagnosis not present

## 2021-11-02 DIAGNOSIS — Z6841 Body Mass Index (BMI) 40.0 and over, adult: Secondary | ICD-10-CM

## 2021-11-02 DIAGNOSIS — Z8249 Family history of ischemic heart disease and other diseases of the circulatory system: Secondary | ICD-10-CM | POA: Diagnosis not present

## 2021-11-02 DIAGNOSIS — I10 Essential (primary) hypertension: Secondary | ICD-10-CM | POA: Diagnosis present

## 2021-11-02 DIAGNOSIS — Z8616 Personal history of COVID-19: Secondary | ICD-10-CM | POA: Diagnosis not present

## 2021-11-02 DIAGNOSIS — Z7952 Long term (current) use of systemic steroids: Secondary | ICD-10-CM | POA: Diagnosis not present

## 2021-11-02 DIAGNOSIS — Z79899 Other long term (current) drug therapy: Secondary | ICD-10-CM | POA: Diagnosis not present

## 2021-11-02 DIAGNOSIS — A419 Sepsis, unspecified organism: Secondary | ICD-10-CM | POA: Diagnosis not present

## 2021-11-02 DIAGNOSIS — I272 Pulmonary hypertension, unspecified: Secondary | ICD-10-CM | POA: Diagnosis present

## 2021-11-02 DIAGNOSIS — B9689 Other specified bacterial agents as the cause of diseases classified elsewhere: Secondary | ICD-10-CM | POA: Diagnosis present

## 2021-11-02 DIAGNOSIS — Z9049 Acquired absence of other specified parts of digestive tract: Secondary | ICD-10-CM

## 2021-11-02 DIAGNOSIS — Z833 Family history of diabetes mellitus: Secondary | ICD-10-CM | POA: Diagnosis not present

## 2021-11-02 DIAGNOSIS — R0902 Hypoxemia: Secondary | ICD-10-CM | POA: Diagnosis not present

## 2021-11-02 DIAGNOSIS — R0600 Dyspnea, unspecified: Principal | ICD-10-CM

## 2021-11-02 DIAGNOSIS — N390 Urinary tract infection, site not specified: Secondary | ICD-10-CM | POA: Diagnosis present

## 2021-11-02 DIAGNOSIS — I2609 Other pulmonary embolism with acute cor pulmonale: Secondary | ICD-10-CM | POA: Diagnosis not present

## 2021-11-02 DIAGNOSIS — I2699 Other pulmonary embolism without acute cor pulmonale: Secondary | ICD-10-CM | POA: Diagnosis not present

## 2021-11-02 DIAGNOSIS — Z809 Family history of malignant neoplasm, unspecified: Secondary | ICD-10-CM | POA: Diagnosis not present

## 2021-11-02 DIAGNOSIS — I110 Hypertensive heart disease with heart failure: Secondary | ICD-10-CM | POA: Diagnosis present

## 2021-11-02 DIAGNOSIS — E872 Acidosis, unspecified: Secondary | ICD-10-CM | POA: Diagnosis present

## 2021-11-02 DIAGNOSIS — I5023 Acute on chronic systolic (congestive) heart failure: Secondary | ICD-10-CM | POA: Diagnosis not present

## 2021-11-02 DIAGNOSIS — R7989 Other specified abnormal findings of blood chemistry: Secondary | ICD-10-CM | POA: Diagnosis not present

## 2021-11-02 DIAGNOSIS — R509 Fever, unspecified: Secondary | ICD-10-CM | POA: Diagnosis not present

## 2021-11-02 DIAGNOSIS — Z85828 Personal history of other malignant neoplasm of skin: Secondary | ICD-10-CM

## 2021-11-02 DIAGNOSIS — R0602 Shortness of breath: Secondary | ICD-10-CM | POA: Diagnosis not present

## 2021-11-02 LAB — LACTIC ACID, PLASMA: Lactic Acid, Venous: 3.3 mmol/L (ref 0.5–1.9)

## 2021-11-02 LAB — URINALYSIS, COMPLETE (UACMP) WITH MICROSCOPIC
Bilirubin Urine: NEGATIVE
Glucose, UA: NEGATIVE mg/dL
Ketones, ur: NEGATIVE mg/dL
Nitrite: NEGATIVE
Protein, ur: 100 mg/dL — AB
Specific Gravity, Urine: 1.024 (ref 1.005–1.030)
WBC, UA: >50 WBC/hpf — ABNORMAL HIGH (ref 0–5)
pH: 5 (ref 5.0–8.0)

## 2021-11-02 LAB — CBC
HCT: 36.8 % (ref 36.0–46.0)
Hemoglobin: 11.7 g/dL — ABNORMAL LOW (ref 12.0–15.0)
MCH: 28.9 pg (ref 26.0–34.0)
MCHC: 31.8 g/dL (ref 30.0–36.0)
MCV: 90.9 fL (ref 80.0–100.0)
Platelets: 214 10*3/uL (ref 150–400)
RBC: 4.05 MIL/uL (ref 3.87–5.11)
RDW: 13.4 % (ref 11.5–15.5)
WBC: 38.2 10*3/uL — ABNORMAL HIGH (ref 4.0–10.5)
nRBC: 0 % (ref 0.0–0.2)

## 2021-11-02 LAB — BLOOD GAS, ARTERIAL
Acid-base deficit: 2.1 mmol/L — ABNORMAL HIGH (ref 0.0–2.0)
Bicarbonate: 21.1 mmol/L (ref 20.0–28.0)
O2 Saturation: 99 %
Patient temperature: 37
pCO2 arterial: 31 mmHg — ABNORMAL LOW (ref 32–48)
pH, Arterial: 7.44 (ref 7.35–7.45)
pO2, Arterial: 82 mmHg — ABNORMAL LOW (ref 83–108)

## 2021-11-02 LAB — PROCALCITONIN: Procalcitonin: 48.56 ng/mL

## 2021-11-02 LAB — BASIC METABOLIC PANEL
Anion gap: 14 (ref 5–15)
BUN: 28 mg/dL — ABNORMAL HIGH (ref 8–23)
CO2: 21 mmol/L — ABNORMAL LOW (ref 22–32)
Calcium: 8.9 mg/dL (ref 8.9–10.3)
Chloride: 104 mmol/L (ref 98–111)
Creatinine, Ser: 1.63 mg/dL — ABNORMAL HIGH (ref 0.44–1.00)
GFR, Estimated: 35 mL/min — ABNORMAL LOW (ref >60)
Glucose, Bld: 214 mg/dL — ABNORMAL HIGH (ref 70–99)
Potassium: 3.8 mmol/L (ref 3.5–5.1)
Sodium: 139 mmol/L (ref 135–145)

## 2021-11-02 LAB — BRAIN NATRIURETIC PEPTIDE: B Natriuretic Peptide: 534.4 pg/mL — ABNORMAL HIGH (ref 0.0–100.0)

## 2021-11-02 LAB — CBG MONITORING, ED: Glucose-Capillary: 225 mg/dL — ABNORMAL HIGH (ref 70–99)

## 2021-11-02 LAB — TROPONIN I (HIGH SENSITIVITY)
Troponin I (High Sensitivity): 1088 ng/L (ref <18)
Troponin I (High Sensitivity): 727 ng/L (ref <18)

## 2021-11-02 LAB — PROTIME-INR
INR: 1.2 (ref 0.8–1.2)
Prothrombin Time: 14.7 seconds (ref 11.4–15.2)

## 2021-11-02 LAB — RESP PANEL BY RT-PCR (FLU A&B, COVID) ARPGX2
Influenza A by PCR: NEGATIVE
Influenza B by PCR: NEGATIVE
SARS Coronavirus 2 by RT PCR: NEGATIVE

## 2021-11-02 LAB — APTT: aPTT: 33 seconds (ref 24–36)

## 2021-11-02 LAB — D-DIMER, QUANTITATIVE: D-Dimer, Quant: 15.58 ug/mL-FEU — ABNORMAL HIGH (ref 0.00–0.50)

## 2021-11-02 LAB — HEPARIN LEVEL (UNFRACTIONATED): Heparin Unfractionated: 0.58 IU/mL (ref 0.30–0.70)

## 2021-11-02 LAB — TSH: TSH: 1.289 u[IU]/mL (ref 0.350–4.500)

## 2021-11-02 MED ORDER — IPRATROPIUM BROMIDE 0.02 % IN SOLN: 0.5000 mg | Freq: Four times a day (QID) | RESPIRATORY_TRACT | Status: DC | PRN

## 2021-11-02 MED ORDER — DILTIAZEM HCL 25 MG/5ML IV SOLN
10.0000 mg | Freq: Once | INTRAVENOUS | Status: AC
  Administered 2021-11-02: 10 mg via INTRAVENOUS
  Filled 2021-11-02: qty 5

## 2021-11-02 MED ORDER — GUAIFENESIN 100 MG/5ML PO LIQD
5.0000 mL | Freq: Once | ORAL | Status: AC | PRN
  Administered 2021-11-02: 5 mL via ORAL
  Filled 2021-11-02: qty 10

## 2021-11-02 MED ORDER — IOHEXOL 350 MG/ML SOLN
60.0000 mL | Freq: Once | INTRAVENOUS | Status: AC | PRN
  Administered 2021-11-02: 60 mL via INTRAVENOUS

## 2021-11-02 MED ORDER — HEPARIN BOLUS VIA INFUSION
4000.0000 [IU] | Freq: Once | INTRAVENOUS | Status: AC
Start: 2021-11-02 — End: 2021-11-02
  Administered 2021-11-02: 4000 [IU] via INTRAVENOUS
  Filled 2021-11-02: qty 4000

## 2021-11-02 MED ORDER — SODIUM CHLORIDE 0.9 % IV SOLN: INTRAVENOUS | Status: AC

## 2021-11-02 MED ORDER — ACETAMINOPHEN 325 MG PO TABS
650.0000 mg | ORAL_TABLET | Freq: Four times a day (QID) | ORAL | Status: DC | PRN
  Administered 2021-11-05: 650 mg via ORAL
  Filled 2021-11-02: qty 2

## 2021-11-02 MED ORDER — INSULIN ASPART 100 UNIT/ML IJ SOLN
0.0000 [IU] | Freq: Three times a day (TID) | INTRAMUSCULAR | Status: DC
  Administered 2021-11-03: 1 [IU] via SUBCUTANEOUS
  Filled 2021-11-02 (×2): qty 1

## 2021-11-02 MED ORDER — ONDANSETRON HCL 4 MG/2ML IJ SOLN: 4.0000 mg | Freq: Four times a day (QID) | INTRAMUSCULAR | Status: DC | PRN

## 2021-11-02 MED ORDER — LACTATED RINGERS IV BOLUS
500.0000 mL | Freq: Once | INTRAVENOUS | Status: AC
Start: 2021-11-02 — End: 2021-11-02
  Administered 2021-11-02: 500 mL via INTRAVENOUS

## 2021-11-02 MED ORDER — SODIUM CHLORIDE 0.9 % IV BOLUS
1000.0000 mL | Freq: Once | INTRAVENOUS | Status: AC
  Administered 2021-11-02: 1000 mL via INTRAVENOUS

## 2021-11-02 MED ORDER — SODIUM CHLORIDE 0.9 % IV SOLN
1.0000 g | INTRAVENOUS | Status: DC
  Administered 2021-11-03: 1 g via INTRAVENOUS
  Filled 2021-11-02: qty 10

## 2021-11-02 MED ORDER — ACETAMINOPHEN 650 MG RE SUPP: 650.0000 mg | Freq: Four times a day (QID) | RECTAL | Status: DC | PRN

## 2021-11-02 MED ORDER — GUAIFENESIN ER 600 MG PO TB12
600.0000 mg | ORAL_TABLET | Freq: Two times a day (BID) | ORAL | Status: DC | PRN
  Administered 2021-11-03 – 2021-11-05 (×2): 600 mg via ORAL
  Filled 2021-11-02 (×2): qty 1

## 2021-11-02 MED ORDER — HEPARIN (PORCINE) 25000 UT/250ML-% IV SOLN
2500.0000 [IU]/h | INTRAVENOUS | Status: DC
  Administered 2021-11-02: 1300 [IU]/h via INTRAVENOUS
  Administered 2021-11-03: 1450 [IU]/h via INTRAVENOUS
  Administered 2021-11-03: 1300 [IU]/h via INTRAVENOUS
  Administered 2021-11-04: 2100 [IU]/h via INTRAVENOUS
  Administered 2021-11-05: 2500 [IU]/h via INTRAVENOUS
  Filled 2021-11-02 (×6): qty 250

## 2021-11-02 MED ORDER — ONDANSETRON HCL 4 MG PO TABS: 4.0000 mg | ORAL_TABLET | Freq: Four times a day (QID) | ORAL | Status: DC | PRN

## 2021-11-02 NOTE — ED Triage Notes (Signed)
Pt to ED for fever cough SOB since 2 days. Chills last night. VS and EKG done in triage already, HR on EKG is 143. HR confirmed, still above 140. Skin dry.  Denies chest pain. SOB with exertion especially walking uphill. No cough at this time.  Pt started weight loss med on Monday. NAD at this time, 96% on RA.

## 2021-11-02 NOTE — Progress Notes (Addendum)
ANTICOAGULATION CONSULT NOTE - Initial Consult  Pharmacy Consult for IV heparin Indication: chest pain/ACS / STEMI  Allergies  Allergen Reactions   Codeine Sulfate    Simvastatin     Other reaction(s): Muscle Pain   Sulphadimidine [Sulfamethazine]     Patient Measurements: Height: '5\' 5"'$  (165.1 cm) Weight: (!) 154.7 kg (341 lb) IBW/kg (Calculated) : 57 Heparin Dosing Weight: 96.3 kg  Vital Signs: Temp: 99.1 F (37.3 C) (08/25 1315) Temp Source: Oral (08/25 1315) BP: 117/85 (08/25 1600) Pulse Rate: 131 (08/25 1600)  Labs: Recent Labs    11/02/21 1531  HGB 11.7*  HCT 36.8  PLT 214  CREATININE 1.63*  TROPONINIHS 1,088*  727*    Estimated Creatinine Clearance: 54.3 mL/min (A) (by C-G formula based on SCr of 1.63 mg/dL (H)).   Medical History: Past Medical History:  Diagnosis Date   Allergy    Cancer ([place])    basal cell skin ca   Diabetes mellitus without complication (HCC)    Hyperlipidemia    Hypertension    Rosacea     Medications:  (Not in a hospital admission)  Scheduled:  Infusions:  PRN: iohexol Anti-infectives (From admission, onward)    None      Not on PTA anticoagulation per my chart review  Assessment: 61 year old female presenting with shortness of breath and tachycardia. Past medical history includes diabetes, hypertension, hyperlipidemia.  Troponins elevated and trending up on repeat (> 1,000).  Baseline CBC acceptable. Baseline aPTT 33.   Goal of Therapy:  Heparin level 0.3-0.7 units/ml Monitor platelets by anticoagulation protocol: Yes   Plan:  Give 4,000 units bolus x 1 Start heparin infusion at 1300 units/hr Check anti-Xa level in 6 hours and daily while on heparin Continue to monitor H&H and platelets   Glean Salvo, PharmD Clinical Pharmacist  11/02/2021 5:06 PM

## 2021-11-02 NOTE — Progress Notes (Addendum)
Called with patient with increase lactic : multifactorial/hypoxemia / possible infection , UA +.  Inflammatory markers pending - will start CTX /increase ivfs/ bolus ordered

## 2021-11-02 NOTE — ED Notes (Signed)
CBG:225 done by EDT Emma at 1320

## 2021-11-02 NOTE — Progress Notes (Signed)
      Daily Progress Note   As I have informed the ED, I do NOT do endovascular PE thrombectomy. Discussed briefly case with Dr. Lucky Cowboy, who recommended: Therapeutic anticoagulation If pt's clinical status worsens, reconsult and Dr. Lucky Cowboy will schedule for Monday If pt becomes unstable, consult IR for PE thrombectomy.     Adele Barthel, MD, FACS, FSVS Covering for [facility]: [phone]  11/02/2021, 6:55 PM

## 2021-11-02 NOTE — ED Notes (Signed)
Lab at bedside to obtain bloodwork

## 2021-11-02 NOTE — ED Provider Notes (Signed)
[facility] Provider Note    Event Date/Time   First MD Initiated Contact with Patient 11/02/21 1417     (approximate)  History   Chief Complaint: Shortness of Breath  HPI  Carolyn Levine is a 61 y.o. female with a past medical history of diabetes, hypertension, hyperlipidemia, presents to the emergency department for shortness of breath and tachycardia.  According to the patient she just started a new weight loss medication on Monday (record review shows this to be phentermine), since that time patient states she has been feeling nauseated and over the past 2 days she has been feeling short of breath with a slight cough.  Patient states she was feeling chills last night.  This morning she awoke and checked her pulse ox which showed normal oxygen level but a pulse rate around 150 bpm.  Patient was concerned so she came to the emergency department.  Patient denies any chest pain.  No pleuritic pain.  No leg pain.  Physical Exam   Triage Vital Signs: ED Triage Vitals  Enc Vitals Group     BP 11/02/21 1315 129/73     Pulse Rate 11/02/21 1315 (!) 144     Resp 11/02/21 1315 17     Temp 11/02/21 1315 99.1 F (37.3 C)     Temp Source 11/02/21 1315 Oral     SpO2 11/02/21 1315 96 %     Weight 11/02/21 1316 (!) 341 lb (154.7 kg)     Height 11/02/21 1316 '5\' 5"'$  (1.651 m)     Head Circumference --      Peak Flow --      Pain Score 11/02/21 1340 0     Pain Loc --      Pain Edu? --      Excl. in [place]? --     Most recent vital signs: Vitals:   11/02/21 1315  BP: 129/73  Pulse: (!) 144  Resp: 17  Temp: 99.1 F (37.3 C)  SpO2: 96%    General: Awake, no distress.  CV:  Good peripheral perfusion.  Regular rhythm rate around 150 bpm. Resp:  Normal effort.  Equal breath sounds bilaterally.  Abd:  No distention.  Soft, nontender.  No rebound or guarding.   ED Results / Procedures / Treatments   EKG  EKG viewed and interpreted by myself shows sinus  tachycardia at 143 bpm with a narrow QRS, normal axis, normal intervals, nonspecific ST changes.  RADIOLOGY  I have personally reviewed and interpreted the x-ray images patient has a elevated right hemidiaphragm however this appears on her prior chest x-ray as well.  No obvious consolidation on my evaluation. Radiology reads chronic elevation of the right hemidiaphragm, slight opacity in the right lung base most consistent with atelectasis.   MEDICATIONS ORDERED IN ED: Medications  sodium chloride 0.9 % bolus 1,000 mL (has no administration in time range)  diltiazem (CARDIZEM) injection 10 mg (10 mg Intravenous Given 11/02/21 1438)  sodium chloride 0.9 % bolus 1,000 mL (1,000 mLs Intravenous New Bag/Given 11/02/21 1438)     IMPRESSION / MDM / ASSESSMENT AND PLAN / ED COURSE  I reviewed the triage vital signs and the nursing notes.  Patient's presentation is most consistent with acute presentation with potential threat to life or bodily function.  Patient presents to the emergency department for rapid heart rate and shortness of breath.  Patient states shortness of breath over the past 2 days only found out that her heart  was rapid when she checked her pulse ox this morning.  Patient states she has had chills as well we will check a COVID test as a precaution.  We will check labs.  Patient started on phentermine Monday.  The phentermine could very likely be the cause of the patient's tachycardia.  Patient's heart rate however is maintaining right at 144 bpm.  It appears to be a sinus rhythm on EKG however could possibly indicate more of an atrial flutter 2-1 block with varied P waves in the QRS complex.  We will dose a one-time dose of 10 mg of diltiazem.  We will IV hydrate.  Patient has no chest pain no pleuritic pain no leg pain however given the tachycardia and dyspnea I have added on a D-dimer, discussed with the patient if this is positive we will need to proceed with CTA of the chest.   Patient agreeable.  Lab work is pending.  Differential would include ACS, stimulant driven tachycardia, dehydration, infectious etiology such as pneumonia or COVID.  FINAL CLINICAL IMPRESSION(S) / ED DIAGNOSES   Dyspnea Tachycardia    Note:  This document was prepared using Dragon voice recognition software and may include unintentional dictation errors.

## 2021-11-02 NOTE — H&P (Addendum)
History and Physical    Carolyn Levine PPI:[id number] DOB: [birth year] DOA: 11/02/2021  PCP: [name], MD  Patient coming from:  home  I have personally briefly reviewed patient's old medical records in [place]  Chief Complaint: cough [name] x 2 days   HPI: Carolyn Levine is a 61 y.o. female with medical history significant of  HTN, DMII,HLD, obesity ,OSA on cpap, qhs basal cell cancer who presents to ED with1- 2 days of sob,and cough. Patient states over the last week she has had increase nausea no emesis. She notes she related this to recently starting Adipex. However yesterday she started to experience sob and cough that was persistent and above her usual baseline sob.  Patient notes since she had COVID 2/23 she has had mild DOE at baseline.  Patient also noted chills and palpitations associated with her symptoms. She however denies chest pain, fever/ presyncope, abdominal pain, leg pain or increase leg swelling from baseline.   ED Course:  On evaluation in ED patient was found to  have b/l segmental /sub segmental PE s with possible heart strain.  Labs also notable for CE 727, 1088. Dr Lucky Cowboy Vascular was consulted who recommended heparin with plans for possible intervention in am.  Vitals: temp 99.1, bp 129/73, hr 144 rr 17 -24 sat 96% on ra  EKG: Sinus tach 144 Further labs: Cxr: Chronic elevation of the right hemidiaphragm. Mild ill-defined opacity within the adjacent right lung base with an appearance favoring atelectasis.   No definite airspace consolidation. D-dimer 15.58  CTPA Multiple segmental and subsegmental bilateral pulmonary emboli, as above. Overall clot burden is moderate.   Na 139, K 3.8, cr 1.63 ( 0.66), fs 214 Ce 727, 1,088   Resp panel neg flu/covid Tx cardizem '10mg'$  , ns 1L , heparin drip Review of Systems: As per HPI otherwise 10 point review of systems negative.   Past Medical History:  Diagnosis Date   Allergy    Cancer ([place])    basal  cell skin ca   Diabetes mellitus without complication ([place])    Hyperlipidemia    Hypertension    Rosacea     Past Surgical History:  Procedure Laterality Date   CHOLECYSTECTOMY  1994     reports that she has never smoked. She has never used smokeless tobacco. She reports that she does not drink alcohol and does not use drugs.  Allergies  Allergen Reactions   Codeine Sulfate    Simvastatin     Other reaction(s): Muscle Pain   Sulphadimidine [Sulfamethazine]     Family History  Problem Relation Age of Onset   Allergy (severe) Sister    Cancer Mother    Diabetes Father    Heart failure Father    Diabetes Sister    Breast cancer Neg Hx     Prior to Admission medications   Medication Sig Start Date End Date Taking? Authorizing Provider  albuterol (VENTOLIN HFA) 108 (90 Base) MCG/ACT inhaler Inhale 2 puffs into the lungs every 6 (six) hours as needed for wheezing or shortness of breath. 04/30/21   Loletha Grayer, MD  ascorbic acid (VITAMIN C) 500 MG tablet Take 1 tablet (500 mg total) by mouth daily. 04/30/21   Loletha Grayer, MD  chlorpheniramine-HYDROcodone 10-8 MG/5ML Take 5 mLs by mouth every 12 (twelve) hours as needed for Cough 05/10/21     guaiFENesin-dextromethorphan (ROBITUSSIN DM) 100-10 MG/5ML syrup Take 5 mLs by mouth every 4 (four) hours as needed for cough. Patient  not taking: Reported on 04/27/2021 01/19/18   Darlin Priestly, PA-C  lisinopril (ZESTRIL) 10 MG tablet TAKE 1 TABLET BY MOUTH ONCE DAILY 01/12/21 01/12/22    lisinopril (ZESTRIL) 20 MG tablet Take 1 tablet (20 mg total) by mouth once daily 05/29/21     metFORMIN (GLUCOPHAGE) 1000 MG tablet Take 1,000 mg by mouth 2 (two) times daily with a meal.    [provider]  metFORMIN (GLUCOPHAGE) 1000 MG tablet Take 1 tablet (1,000 mg total) by mouth 2 (two) times daily 02/09/21     phentermine (ADIPEX-P) 37.5 MG tablet Take 1 tablet (37.5 mg total) by mouth every morning before breakfast for 120 days 10/16/21      pioglitazone (ACTOS) 30 MG tablet TAKE 1 TABLET BY MOUTH ONCE DAILY 10/15/21 10/15/22    polyethylene glycol (MIRALAX / GLYCOLAX) 17 g packet Take 17 g by mouth daily as needed. 04/30/21   Loletha Grayer, MD  polyvinyl alcohol (LIQUIFILM TEARS) 1.4 % ophthalmic solution Place 2 drops into the left eye every 2 (two) hours as needed for dry eyes. 04/30/21   Loletha Grayer, MD  predniSONE (DELTASONE) 10 MG tablet 3 tabs po day 1,2;  2 tabs po day3,4;  1 tab po day5,6 04/30/21   Loletha Grayer, MD  rosuvastatin (CRESTOR) 5 MG tablet Take 5 mg by mouth daily. Takes every three days    [provider]  rosuvastatin (CRESTOR) 5 MG tablet Take 1 tablet (5 mg total) by mouth once daily 05/29/21     traZODone (DESYREL) 50 MG tablet Take 1 tablet (50 mg total) by mouth at bedtime 01/29/21     zinc sulfate 220 (50 Zn) MG capsule Take 1 capsule (220 mg total) by mouth daily. 05/01/21   Loletha Grayer, MD    Physical Exam: Vitals:   11/02/21 1630 11/02/21 1700 11/02/21 1741 11/02/21 1745  BP: 112/70 (!) 127/56 (!) 155/115   Pulse: (!) 133 (!) 140 (!) 157 (!) 151  Resp: (!) 25 (!) 32 (!) 28 (!) 34  Temp:   99.5 F (37.5 C)   TempSrc:   Oral   SpO2: 98% 98% 100% 99%  Weight:      Height:         Vitals:   11/02/21 1630 11/02/21 1700 11/02/21 1741 11/02/21 1745  BP: 112/70 (!) 127/56 (!) 155/115   Pulse: (!) 133 (!) 140 (!) 157 (!) 151  Resp: (!) 25 (!) 32 (!) 28 (!) 34  Temp:   99.5 F (37.5 C)   TempSrc:   Oral   SpO2: 98% 98% 100% 99%  Weight:      Height:      Constitutional: NAD, calm, comfortable Eyes: PERRL, lids and conjunctivae normal ENMT: Mucous membranes are moist. Posterior pharynx clear of any exudate or lesions.Normal dentition.  Neck: normal, supple, no masses, no thyromegaly Respiratory: clear to auscultation bilaterally, no wheezing, no crackles. Normal respiratory effort. No accessory muscle use.  Cardiovascular: Regular rate and rhythm, no murmurs / rubs /  gallops.  B/l extremity edema. Warm extremities . Abdomen: obese no apppreciable tenderness, no masses palpated. No hepatosplenomegaly. Bowel sounds positive.  Musculoskeletal: no clubbing / cyanosis. No joint deformity upper and lower extremities. Good ROM, no contractures. Normal muscle tone.  Skin: no rashes, lesions, ulcers. No induration Neurologic: CN 2-12 grossly intact. Sensation intact,Strength 5/5 in all 4.  Psychiatric: Normal judgment and insight. Alert and oriented x 3. Normal mood.    Labs on Admission: I have personally reviewed  following labs and imaging studies  CBC: Recent Labs  Lab 11/02/21 1531  WBC 38.2*  HGB 11.7*  HCT 36.8  MCV 90.9  PLT 299   Basic Metabolic Panel: Recent Labs  Lab 11/02/21 1531  NA 139  K 3.8  CL 104  CO2 21*  GLUCOSE 214*  BUN 28*  CREATININE 1.63*  CALCIUM 8.9   GFR: Estimated Creatinine Clearance: 54.3 mL/min (A) (by C-G formula based on SCr of 1.63 mg/dL (H)). Liver Function Tests: No results for input(s): "AST", "ALT", "ALKPHOS", "BILITOT", "PROT", "ALBUMIN" in the last 168 hours. No results for input(s): "LIPASE", "AMYLASE" in the last 168 hours. No results for input(s): "AMMONIA" in the last 168 hours. Coagulation Profile: Recent Labs  Lab 11/02/21 1531  INR 1.2   Cardiac Enzymes: No results for input(s): "CKTOTAL", "CKMB", "CKMBINDEX", "TROPONINI" in the last 168 hours. BNP (last 3 results) No results for input(s): "PROBNP" in the last 8760 hours. HbA1C: No results for input(s): "HGBA1C" in the last 72 hours. CBG: Recent Labs  Lab 11/02/21 1320  GLUCAP 225*   Lipid Profile: No results for input(s): "CHOL", "HDL", "LDLCALC", "TRIG", "CHOLHDL", "LDLDIRECT" in the last 72 hours. Thyroid Function Tests: No results for input(s): "TSH", "T4TOTAL", "FREET4", "T3FREE", "THYROIDAB" in the last 72 hours. Anemia Panel: No results for input(s): "VITAMINB12", "FOLATE", "FERRITIN", "TIBC", "IRON", "RETICCTPCT" in the  last 72 hours. Urine analysis: No results found for: "COLORURINE", "APPEARANCEUR", "LABSPEC", "PHURINE", "GLUCOSEU", "HGBUR", "BILIRUBINUR", "KETONESUR", "PROTEINUR", "UROBILINOGEN", "NITRITE", "LEUKOCYTESUR"  Radiological Exams on Admission: CT Angio Chest PE W/Cm &/Or Wo Cm  Result Date: 11/02/2021 CLINICAL DATA:  Fever, cough, shortness of breath x2 days. Tachycardia. Hypoxia. EXAM: CT ANGIOGRAPHY CHEST WITH CONTRAST TECHNIQUE: Multidetector CT imaging of the chest was performed using the standard protocol during bolus administration of intravenous contrast. Multiplanar CT image reconstructions and MIPs were obtained to evaluate the vascular anatomy. RADIATION DOSE REDUCTION: This exam was performed according to the departmental dose-optimization program which includes automated exposure control, adjustment of the mA and/or kV according to patient size and/or use of iterative reconstruction technique. CONTRAST:  59m OMNIPAQUE IOHEXOL 350 MG/ML SOLN COMPARISON:  Chest radiograph dated 11/02/2021. FINDINGS: Cardiovascular: Suboptimal contrast opacification in the pulmonary arteries, with preferential opacification of the aorta and left heart, suggesting suboptimal bolus timing. However, despite these limitations, the study is positive for multiple segmental and subsegmental pulmonary emboli in the left upper lobe (series 7/image 115), right upper lobe (series 7/images 93, 102, 111, 116, and 126), and in the left lower lobe and lingula (series 7/image 162). Overall clot burden is moderate. Enlargement of the main pulmonary artery, suggesting pulmonary arterial hypertension. Mildly enlarged RV to LV ratio, 1.08. However, given the lack central pulmonary embolism (lobar or above), this is not considered specific for right heart strain. No evidence of thoracic aortic aneurysm or dissection. Cardiomegaly.  No pericardial effusion. Three vessel coronary atherosclerosis. Mediastinum/Nodes: No suspicious  mediastinal lymphadenopathy. Visualized thyroid is unremarkable. Lungs/Pleura: Eventration right hemidiaphragm with associated right basilar scarring/atelectasis. Volume loss/atelectasis in the right middle lobe, chronic. Mild ground-glass opacity/mosaic attenuation in the lungs bilaterally, chronic. No findings suspicious for pulmonary infarct. No suspicious pulmonary nodules. No pleural effusion or pneumothorax. Upper Abdomen: Visualized upper abdomen is notable for prior cholecystectomy. Musculoskeletal: Degenerative changes of the visualized thoracolumbar spine. Review of the MIP images confirms the above findings. IMPRESSION: Multiple segmental and subsegmental bilateral pulmonary emboli, as above. Overall clot burden is moderate. Critical Value/emergent results were called by telephone at the time of  interpretation on 11/02/2021 at 5:42 pm to provider [facility] , who verbally acknowledged these results. Electronically Signed   By: Julian Hy M.D.   On: 11/02/2021 17:47   DG Chest 2 View  Result Date: 11/02/2021 CLINICAL DATA:  Provided history: Cough, shortness of breath. Fever for 2 days. Heart rate elevation. EXAM: CHEST - 2 VIEW COMPARISON:  CT angiogram chest 04/27/2021. Prior chest radiographs 04/27/2021. FINDINGS: Heart size within normal limits. Chronic elevation of the right hemidiaphragm. Mild ill-defined opacity within the adjacent right lung base with an appearance favoring atelectasis. No definite airspace consolidation. No evidence of pleural effusion or pneumothorax. No acute bony abnormality identified. IMPRESSION: Chronic elevation of the right hemidiaphragm. Mild ill-defined opacity within the adjacent right lung base with an appearance favoring atelectasis. No definite airspace consolidation. Electronically Signed   By: Kellie Simmering D.O.   On: 11/02/2021 14:18    EKG: Independently reviewed.   Assessment/Plan   Acute b/L Segmental /subsegmental  Pulmonary Emboli with  presumed heart strain - admit to step down  -continue on heparin drip  - npo midnight for possible ir/vascular intervention in am , Dr Lucky Cowboy consulted  - monitor on continuous pulse ox  -abg to be complete -echo in am  -risk factors obesity / sedentary life style -await final vascular recs  Abn CE / NSTEMI type II due to Heart strain -high  CE 1088, ekg sinus tachycardia in setting of moderate burden PE with heart strain  -will cycle CE -echo pending for am   AKI  -cr 1.6 base ( o.66) -avoid nephrotoxic meds -gently ivfs   HTN -stable  -hold ace due to AKI -monitor closely   OSA -cpap qhs  DMII -is [place] -resume home regimen once med rec has been completed  HLD -continue statin    Obesity: risk factor for current diagnosis / prognosis   DVT prophylaxis: heparin drip Code Status:  Full Family Communication:  Disposition Plan: patient  expected to be admitted greater than 2 midnights  Consults called: Dr Lucky Cowboy vascular  Admission status: SDU   Clance Boll MD [facility]  If 7PM-7AM, please contact night-coverage [email or web address] Password [facility]  11/02/2021, 6:36 PM

## 2021-11-02 NOTE — ED Provider Notes (Signed)
Emergency department handoff note  Care of this patient was signed out to me at the end of the previous provider shift.  All pertinent patient information was conveyed and all questions were answered.  Patient pending laboratory evaluation including CT angiography of the chest which showed bilateral segmental and subsegmental pulmonary emboli.  Patient was placed on heparin as well as spoke to Dr. Lucky Cowboy and vascular surgery due to elevated troponin and increased size of patient's right heart, who recommended continuing heparin drip and evaluation tomorrow for possible thrombectomy.  I spoke with patient at length about the results as well as the plan and she is in agreement with admission to the internal medicine service.  I spoke with on-call hospitalist who agrees with plan for admission for further evaluation and management.  Dispo: Admit to medicine  CRITICAL CARE Performed by: Naaman Plummer  Total critical care time: 37 minutes  Critical care time was exclusive of separately billable procedures and treating other patients.  Critical care was necessary to treat or prevent imminent or life-threatening deterioration.  Critical care was time spent personally by me on the following activities: development of treatment plan with patient and/or surrogate as well as nursing, discussions with consultants, evaluation of patient's response to treatment, examination of patient, obtaining history from patient or surrogate, ordering and performing treatments and interventions, ordering and review of laboratory studies, ordering and review of radiographic studies, pulse oximetry and re-evaluation of patient's condition.    Naaman Plummer, MD 11/02/21 2049

## 2021-11-02 NOTE — Progress Notes (Signed)
Patient seen for abg sample. On o2 and has home cpap available for use. Self administers. Appears to be in proper working order.

## 2021-11-02 NOTE — ED Notes (Signed)
Pt assisted to toilet and changed into hospital gown. Linens changed and clean chux applied to bed and recliner

## 2021-11-03 ENCOUNTER — Inpatient Hospital Stay
Admit: 2021-11-03 | Discharge: 2021-11-03 | Disposition: A | Discharge status: Home / Self Care | Payer: 59 | Attending: Internal Medicine | Admitting: Internal Medicine

## 2021-11-03 ENCOUNTER — Encounter: Payer: Self-pay | Admitting: Internal Medicine

## 2021-11-03 DIAGNOSIS — I214 Non-ST elevation (NSTEMI) myocardial infarction: Secondary | ICD-10-CM | POA: Diagnosis present

## 2021-11-03 DIAGNOSIS — N179 Acute kidney failure, unspecified: Secondary | ICD-10-CM

## 2021-11-03 DIAGNOSIS — E785 Hyperlipidemia, unspecified: Secondary | ICD-10-CM

## 2021-11-03 DIAGNOSIS — A419 Sepsis, unspecified organism: Secondary | ICD-10-CM

## 2021-11-03 DIAGNOSIS — R6520 Severe sepsis without septic shock: Secondary | ICD-10-CM

## 2021-11-03 DIAGNOSIS — Z6841 Body Mass Index (BMI) 40.0 and over, adult: Secondary | ICD-10-CM

## 2021-11-03 DIAGNOSIS — I5021 Acute systolic (congestive) heart failure: Secondary | ICD-10-CM | POA: Diagnosis not present

## 2021-11-03 DIAGNOSIS — I2699 Other pulmonary embolism without acute cor pulmonale: Secondary | ICD-10-CM | POA: Diagnosis not present

## 2021-11-03 LAB — BLOOD CULTURE ID PANEL (REFLEXED) - BCID2

## 2021-11-03 LAB — CBC
HCT: 35.1 % — ABNORMAL LOW (ref 36.0–46.0)
Hemoglobin: 11.2 g/dL — ABNORMAL LOW (ref 12.0–15.0)
MCH: 28.9 pg (ref 26.0–34.0)
MCHC: 31.9 g/dL (ref 30.0–36.0)
MCV: 90.5 fL (ref 80.0–100.0)
Platelets: 130 10*3/uL — ABNORMAL LOW (ref 150–400)
RBC: 3.88 MIL/uL (ref 3.87–5.11)
RDW: 13.5 % (ref 11.5–15.5)
WBC: 25.2 10*3/uL — ABNORMAL HIGH (ref 4.0–10.5)
nRBC: 0 % (ref 0.0–0.2)

## 2021-11-03 LAB — BLOOD GAS, ARTERIAL
Acid-Base Excess: 2.9 mmol/L — ABNORMAL HIGH (ref 0.0–2.0)
Bicarbonate: 27.1 mmol/L (ref 20.0–28.0)
FIO2: 28 %
O2 Saturation: 96.6 %
pCO2 arterial: 39 mmHg (ref 32–48)
pH, Arterial: 7.45 (ref 7.35–7.45)
pO2, Arterial: 83 mmHg (ref 83–108)

## 2021-11-03 LAB — LACTIC ACID, PLASMA
Lactic Acid, Venous: 1.5 mmol/L (ref 0.5–1.9)
Lactic Acid, Venous: 3.2 mmol/L (ref 0.5–1.9)

## 2021-11-03 LAB — COMPREHENSIVE METABOLIC PANEL
ALT: 27 U/L (ref 0–44)
AST: 41 U/L (ref 15–41)
Albumin: 3.2 g/dL — ABNORMAL LOW (ref 3.5–5.0)
Alkaline Phosphatase: 63 U/L (ref 38–126)
Anion gap: 6 (ref 5–15)
BUN: 32 mg/dL — ABNORMAL HIGH (ref 8–23)
CO2: 23 mmol/L (ref 22–32)
Calcium: 8 mg/dL — ABNORMAL LOW (ref 8.9–10.3)
Chloride: 107 mmol/L (ref 98–111)
Creatinine, Ser: 1.35 mg/dL — ABNORMAL HIGH (ref 0.44–1.00)
GFR, Estimated: 44 mL/min — ABNORMAL LOW (ref >60)
Glucose, Bld: 159 mg/dL — ABNORMAL HIGH (ref 70–99)
Potassium: 3.9 mmol/L (ref 3.5–5.1)
Sodium: 136 mmol/L (ref 135–145)
Total Bilirubin: 1.4 mg/dL — ABNORMAL HIGH (ref 0.3–1.2)
Total Protein: 7.1 g/dL (ref 6.5–8.1)

## 2021-11-03 LAB — BASIC METABOLIC PANEL
Anion gap: 16 — ABNORMAL HIGH (ref 5–15)
BUN: 30 mg/dL — ABNORMAL HIGH (ref 8–23)
CO2: 19 mmol/L — ABNORMAL LOW (ref 22–32)
Calcium: 8.8 mg/dL — ABNORMAL LOW (ref 8.9–10.3)
Chloride: 105 mmol/L (ref 98–111)
Creatinine, Ser: 1.6 mg/dL — ABNORMAL HIGH (ref 0.44–1.00)
GFR, Estimated: 36 mL/min — ABNORMAL LOW (ref >60)
Glucose, Bld: 191 mg/dL — ABNORMAL HIGH (ref 70–99)
Potassium: 4.4 mmol/L (ref 3.5–5.1)
Sodium: 140 mmol/L (ref 135–145)

## 2021-11-03 LAB — TROPONIN I (HIGH SENSITIVITY)
Troponin I (High Sensitivity): 2779 ng/L (ref <18)
Troponin I (High Sensitivity): 2773 ng/L (ref <18)
Troponin I (High Sensitivity): 2713 ng/L (ref <18)
Troponin I (High Sensitivity): 2905 ng/L (ref <18)

## 2021-11-03 LAB — PLATELET COUNT: Platelets: 133 10*3/uL — ABNORMAL LOW (ref 150–400)

## 2021-11-03 LAB — HEPARIN LEVEL (UNFRACTIONATED)
Heparin Unfractionated: 0.22 IU/mL — ABNORMAL LOW (ref 0.30–0.70)
Heparin Unfractionated: 0.26 IU/mL — ABNORMAL LOW (ref 0.30–0.70)
Heparin Unfractionated: 0.33 IU/mL (ref 0.30–0.70)

## 2021-11-03 LAB — GLUCOSE, CAPILLARY
Glucose-Capillary: 146 mg/dL — ABNORMAL HIGH (ref 70–99)
Glucose-Capillary: 165 mg/dL — ABNORMAL HIGH (ref 70–99)
Glucose-Capillary: 149 mg/dL — ABNORMAL HIGH (ref 70–99)

## 2021-11-03 LAB — ECHOCARDIOGRAM COMPLETE
Height: 65 in
Weight: 5456 oz

## 2021-11-03 LAB — HEMOGLOBIN A1C
Hgb A1c MFr Bld: 6.3 % — ABNORMAL HIGH (ref 4.8–5.6)
Mean Plasma Glucose: 134.11 mg/dL

## 2021-11-03 LAB — MRSA NEXT GEN BY PCR, NASAL: MRSA by PCR Next Gen: NOT DETECTED

## 2021-11-03 MED ORDER — INSULIN ASPART 100 UNIT/ML IJ SOLN: 0.0000 [IU] | Freq: Every day | INTRAMUSCULAR | Status: DC

## 2021-11-03 MED ORDER — PERFLUTREN LIPID MICROSPHERE
1.0000 mL | INTRAVENOUS | Status: AC | PRN
  Administered 2021-11-03: 2 mL via INTRAVENOUS

## 2021-11-03 MED ORDER — GUAIFENESIN-DM 100-10 MG/5ML PO SYRP
5.0000 mL | ORAL_SOLUTION | ORAL | Status: DC | PRN
  Administered 2021-11-03 – 2021-11-06 (×9): 5 mL via ORAL
  Filled 2021-11-03 (×4): qty 5
  Filled 2021-11-03 (×3): qty 10
  Filled 2021-11-03 (×2): qty 5

## 2021-11-03 MED ORDER — ROSUVASTATIN CALCIUM 10 MG PO TABS
5.0000 mg | ORAL_TABLET | Freq: Every day | ORAL | Status: DC
  Administered 2021-11-04: 5 mg via ORAL
  Filled 2021-11-03 (×2): qty 1

## 2021-11-03 MED ORDER — METOPROLOL TARTRATE 5 MG/5ML IV SOLN
5.0000 mg | INTRAVENOUS | Status: DC | PRN
Start: 2021-11-03 — End: 2021-11-07
  Administered 2021-11-03: 5 mg via INTRAVENOUS
  Filled 2021-11-03: qty 5

## 2021-11-03 MED ORDER — HEPARIN BOLUS VIA INFUSION
1400.0000 [IU] | Freq: Once | INTRAVENOUS | Status: AC
  Administered 2021-11-03: 1400 [IU] via INTRAVENOUS
  Filled 2021-11-03: qty 1400

## 2021-11-03 MED ORDER — SODIUM CHLORIDE 0.9 % IV SOLN
2.0000 g | Freq: Three times a day (TID) | INTRAVENOUS | Status: AC
  Administered 2021-11-03 – 2021-11-05 (×8): 2 g via INTRAVENOUS
  Filled 2021-11-03 (×2): qty 2
  Filled 2021-11-03: qty 12.5
  Filled 2021-11-03: qty 2
  Filled 2021-11-03 (×2): qty 12.5
  Filled 2021-11-03: qty 2
  Filled 2021-11-03 (×3): qty 12.5

## 2021-11-03 MED ORDER — TRAZODONE HCL 50 MG PO TABS
50.0000 mg | ORAL_TABLET | Freq: Every day | ORAL | Status: DC
  Administered 2021-11-03 – 2021-11-06 (×4): 50 mg via ORAL
  Filled 2021-11-03 (×4): qty 1

## 2021-11-03 MED ORDER — BENZONATATE 100 MG PO CAPS
200.0000 mg | ORAL_CAPSULE | Freq: Three times a day (TID) | ORAL | Status: DC | PRN
  Administered 2021-11-03 – 2021-11-06 (×4): 200 mg via ORAL
  Filled 2021-11-03 (×4): qty 2

## 2021-11-03 MED ORDER — CHLORHEXIDINE GLUCONATE CLOTH 2 % EX PADS
6.0000 | MEDICATED_PAD | Freq: Every day | CUTANEOUS | Status: DC
  Administered 2021-11-03 – 2021-11-07 (×5): 6 via TOPICAL

## 2021-11-03 MED ORDER — SODIUM CHLORIDE 0.9 % IV SOLN
2.0000 g | INTRAVENOUS | Status: DC
Start: 2021-11-03 — End: 2021-11-03

## 2021-11-03 MED ORDER — INSULIN ASPART 100 UNIT/ML IJ SOLN
0.0000 [IU] | Freq: Three times a day (TID) | INTRAMUSCULAR | Status: DC
  Administered 2021-11-03 – 2021-11-05 (×4): 2 [IU] via SUBCUTANEOUS
  Administered 2021-11-06: 3 [IU] via SUBCUTANEOUS
  Administered 2021-11-06: 2 [IU] via SUBCUTANEOUS
  Administered 2021-11-07: 3 [IU] via SUBCUTANEOUS
  Administered 2021-11-07: 2 [IU] via SUBCUTANEOUS
  Filled 2021-11-03 (×8): qty 1

## 2021-11-03 NOTE — Assessment & Plan Note (Signed)
Patient was on metformin and Actos at home. -Moderate scale SSI

## 2021-11-03 NOTE — Assessment & Plan Note (Signed)
Most likely secondary to sepsis. Some improvement to creatinine at 1.35 today, baseline less than 1. -Continue with gentle IV hydration -Monitor renal function Avoid nephrotoxins-

## 2021-11-03 NOTE — Progress Notes (Signed)
ED report received, awaiting patient.

## 2021-11-03 NOTE — Progress Notes (Signed)
ANTICOAGULATION CONSULT NOTE  Pharmacy Consult for IV heparin Indication: chest pain/ACS/STEMI and bilateral pulmonary emboli  Allergies  Allergen Reactions   Codeine Sulfate    Simvastatin     Other reaction(s): Muscle Pain   Sulphadimidine [Sulfamethazine]    Patient Measurements: Height: '5\' 5"'$  (165.1 cm) Weight: (!) 154.7 kg (341 lb) IBW/kg (Calculated) : 57 Heparin Dosing Weight: 96.3 kg  Vital Signs: Temp: 98 F (36.7 C) (08/26 1600) Temp Source: Oral (08/26 1600) BP: 110/51 (08/26 1900) Pulse Rate: 117 (08/26 2000)  Labs: Recent Labs    11/02/21 1531 11/02/21 2104 11/02/21 2104 11/03/21 0454 11/03/21 0828 11/03/21 1032 11/03/21 1227 11/03/21 1432 11/03/21 2113  HGB 11.7*  --   --  11.2*  --   --   --   --   --   HCT 36.8  --   --  35.1*  --   --   --   --   --   PLT 214  --   --  130*  --   --  133*  --   --   APTT 33  --   --   --   --   --   --   --   --   LABPROT 14.7  --   --   --   --   --   --   --   --   INR 1.2  --   --   --   --   --   --   --   --   HEPARINUNFRC  --  0.58   < > 0.22*  --   --  0.26*  --  0.33  CREATININE 1.63* 1.60*  --  1.35*  --   --   --   --   --   TROPONINIHS 1,088*  727*  --   --   --    < > 2,773* 2,713* 2,905*  --    < > = values in this interval not displayed.     Estimated Creatinine Clearance: 65.5 mL/min (A) (by C-G formula based on SCr of 1.35 mg/dL (H)).   Medical History: Past Medical History:  Diagnosis Date   Allergy    Cancer ([place])    basal cell skin ca   Diabetes mellitus without complication (HCC)    Hyperlipidemia    Hypertension    Rosacea     Medications:  Medications Prior to Admission  Medication Sig Dispense Refill Last Dose   lisinopril (ZESTRIL) 20 MG tablet Take 1 tablet (20 mg total) by mouth once daily 90 tablet 4 Past Week   metFORMIN (GLUCOPHAGE) 1000 MG tablet Take 1 tablet (1,000 mg total) by mouth 2 (two) times daily 180 tablet 3 Past Week   phentermine (ADIPEX-P) 37.5 MG tablet  Take 1 tablet (37.5 mg total) by mouth every morning before breakfast for 120 days 30 tablet 3 Past Week   pioglitazone (ACTOS) 30 MG tablet TAKE 1 TABLET BY MOUTH ONCE DAILY 90 tablet 3 Past Week   rosuvastatin (CRESTOR) 5 MG tablet Take 1 tablet (5 mg total) by mouth once daily 90 tablet 3 Past Week   traZODone (DESYREL) 50 MG tablet Take 1 tablet (50 mg total) by mouth at bedtime 90 tablet 3 Past Week at 2200   Scheduled:   Chlorhexidine Gluconate Cloth  6 each Topical Daily   insulin aspart  0-15 Units Subcutaneous TID WC   insulin aspart  0-5 Units Subcutaneous  QHS   rosuvastatin  5 mg Oral Daily   traZODone  50 mg Oral QHS   Infusions:   ceFEPime (MAXIPIME) IV 2 g (11/03/21 2138)   heparin 1,600 Units/hr (11/03/21 1520)   PRN: acetaminophen **OR** acetaminophen, benzonatate, guaiFENesin, guaiFENesin-dextromethorphan, ipratropium, metoprolol tartrate, ondansetron **OR** ondansetron (ZOFRAN) IV Anti-infectives (From admission, onward)    Start     Dose/Rate Route Frequency Ordered Stop   11/03/21 2200  cefTRIAXone (ROCEPHIN) 2 g in sodium chloride 0.9 % 100 mL IVPB  Status:  Discontinued        2 g 200 mL/hr over 30 Minutes Intravenous Every 24 hours 11/03/21 0752 11/03/21 1158   11/03/21 1400  ceFEPIme (MAXIPIME) 2 g in sodium chloride 0.9 % 100 mL IVPB        2 g 200 mL/hr over 30 Minutes Intravenous Every 8 hours 11/03/21 1158     11/02/21 2215  cefTRIAXone (ROCEPHIN) 1 g in sodium chloride 0.9 % 100 mL IVPB  Status:  Discontinued        1 g 200 mL/hr over 30 Minutes Intravenous Every 24 hours 11/02/21 2204 11/03/21 0752       Assessment: Pt is a 61 yo F with PMH HTN, T2DM, HLD, obesity, OSA on CPAP, basal cell cancer. Pt presented with 2-day h/o cough and shortness of breath. CT identified multiple segmental and subsegmental bilateral pulmonary emboli, as above. Troponins elevated at presentation and remain elevated at 2,713. ECG pending. Pt endorses increasing nausea over  the past week and also notes that she had COVID in Feb 2023 and since then has had DOE at baseline. Bcx collected, growing GNRs (Enterobacterales, speciation not identified by BCID) in 4/4 bottles. PLT dropped from 214 >> 133 after starting heparin but since then have stabilized. Pt not any anticoagulation PTA.  Goal of Therapy:  Heparin level 0.3-0.7 units/ml Monitor platelets by anticoagulation protocol: Yes   Date Time HL Rate/Comment 8/26 0454 0.22 Subtherapeutic x 1 8/26 1227 0.26 Subtherapeutic x 2  Plan: HL 0.33 therapeutic x 1 Continue heparin infusion at 1600 units/hr Recheck HL in 6 hrs Monitor CBC daily while on heparin, especially PLT in this patient given initial downtrend following starting heparin  Glean Salvo, PharmD Clinical Pharmacist  11/03/2021 10:17 PM

## 2021-11-03 NOTE — Consult Note (Signed)
CARDIOLOGY CONSULT NOTE               Patient ID: Levine Levine MRN: [id number] DOB/AGE: 63-Aug-1962 63 y.o.  Admit date: 11/02/2021 Referring Physician Dr Levine Levine hospitalist Primary Physician Dr. [place] Levine Primary Cardiologist Levine Levine Reason for Consultation elevated troponin shortness of breath tachycardia hypertension  HPI: 61 year old morbidly obese white female history of hypertension diabetes hyperlipidemia obstructive sleep apnea previous COVID-pneumonia with significant lung disease presented with elevated troponins unremarkable EKG, no chest pain patient was also found to have small distal pulmonary emboli but persistent tachycardia patient was also found to be bacteremic now cardiology needed for further evaluation  Review of systems complete and found to be negative unless listed above     Past Medical History:  Diagnosis Date   Allergy    Cancer ([place])    basal cell skin ca   Diabetes mellitus without complication ([place])    Hyperlipidemia    Hypertension    Rosacea     Past Surgical History:  Procedure Laterality Date   CHOLECYSTECTOMY  1994    Medications Prior to Admission  Medication Sig Dispense Refill Last Dose   lisinopril (ZESTRIL) 20 MG tablet Take 1 tablet (20 mg total) by mouth once daily 90 tablet 4 Past Week   metFORMIN (GLUCOPHAGE) 1000 MG tablet Take 1 tablet (1,000 mg total) by mouth 2 (two) times daily 180 tablet 3 Past Week   phentermine (ADIPEX-P) 37.5 MG tablet Take 1 tablet (37.5 mg total) by mouth every morning before breakfast for 120 days 30 tablet 3 Past Week   pioglitazone (ACTOS) 30 MG tablet TAKE 1 TABLET BY MOUTH ONCE DAILY 90 tablet 3 Past Week   rosuvastatin (CRESTOR) 5 MG tablet Take 1 tablet (5 mg total) by mouth once daily 90 tablet 3 Past Week   traZODone (DESYREL) 50 MG tablet Take 1 tablet (50 mg total) by mouth at bedtime 90 tablet 3 Past Week at 2200   Social History   Socioeconomic History   Marital  status: Married    Spouse name: Not on file   Number of children: Not on file   Years of education: Not on file   Highest education level: Not on file  Occupational History   Not on file  Tobacco Use   Smoking status: Never   Smokeless tobacco: Never  Substance and Sexual Activity   Alcohol use: No    Alcohol/week: 0.0 standard drinks of alcohol   Drug use: No   Sexual activity: Not on file  Other Topics Concern   Not on file  Social History Narrative   Not on file   Social Determinants of Health   Financial Resource Strain: Not on file  Food Insecurity: Not on file  Transportation Needs: Not on file  Physical Activity: Not on file  Stress: Not on file  Social Connections: Not on file  Intimate Partner Violence: Not on file    Family History  Problem Relation Age of Onset   Allergy (severe) Sister    Cancer Mother    Diabetes Father    Heart failure Father    Diabetes Sister    Breast cancer Neg Hx       Review of systems complete and found to be negative unless listed above      PHYSICAL EXAM  General: Well developed, well nourished, in no acute distress HEENT:  Normocephalic and atramatic Neck:  No JVD.  Lungs: Clear bilaterally to auscultation and percussion. Heart:  HRRR . Normal S1 and S2 without gallops or murmurs.  Abdomen: Bowel sounds are positive, abdomen soft and non-tender  Msk:  Back normal, normal gait. Normal strength and tone for age. Extremities: No clubbing, cyanosis or edema.   Neuro: Alert and oriented X 3. Psych:  Good affect, responds appropriately  Labs:   Lab Results  Component Value Date   WBC 25.2 (H) 11/03/2021   HGB 11.2 (L) 11/03/2021   HCT 35.1 (L) 11/03/2021   MCV 90.5 11/03/2021   PLT 130 (L) 11/03/2021    Recent Labs  Lab 11/03/21 0454  NA 136  K 3.9  CL 107  CO2 23  BUN 32*  CREATININE 1.35*  CALCIUM 8.0*  PROT 7.1  BILITOT 1.4*  ALKPHOS 63  ALT 27  AST 41  GLUCOSE 159*   No results found for:  "CKTOTAL", "CKMB", "CKMBINDEX", "TROPONINI" No results found for: "CHOL" No results found for: "HDL" No results found for: "LDLCALC" No results found for: "TRIG" No results found for: "CHOLHDL" No results found for: "LDLDIRECT"    Radiology: ECHOCARDIOGRAM COMPLETE  Result Date: 11/03/2021    ECHOCARDIOGRAM REPORT   Patient Name:   Levine Levine Date of Exam: 11/03/2021 Medical Rec #:  [id number]       Height:       65.0 in Accession #:    [id number]      Weight:       341.0 lb Date of Birth:  1961-01-11       BSA:          2.482 m Patient Age:    61 years        BP:           111/56 mmHg Patient Gender: F               HR:           130 bpm. Exam Location:  [facility] Procedure: 2D Echo and Intracardiac Opacification Agent Indications:     Pulmonary Embolus I26.09  History:         Patient has prior history of Echocardiogram examinations, most                  recent 04/29/2021.  Sonographer:     Levine Levine RDCS Referring Phys:  [id number] SARA-MAIZ A THOMAS Diagnosing Phys: Carolyn Kida MD  Sonographer Comments: Technically challenging study due to limited acoustic windows, no apical window, no subcostal window, patient is obese and no parasternal window. Image acquisition challenging due to patient body habitus. IMPRESSIONS  1. Probably mod reduced LVF globally EF=30-35%?.  2. Very TDS Poor windows.  3. TDS. Left ventricular ejection fraction, by estimation, is 30 to 35%. The left ventricle has moderately decreased function. The left ventricle demonstrates regional wall motion abnormalities (see scoring diagram/findings for description). The left ventricular internal cavity size was moderately dilated. Left ventricular diastolic function could not be evaluated.  4. TDS. Right ventricular systolic function was not well visualized. The right ventricular size is moderately enlarged.  5. TDS.  6. The mitral valve was not well visualized. TDS mitral valve regurgitation.  7. Tricuspid valve regurgitation TDS.   8. The aortic valve was not well visualized. Unable to determine aortic valve morphology due to image quality. Aortic valve regurgitation TDS.  9. Pulmonic valve regurgitation TDS. Conclusion(s)/Recommendation(s): Poor windows for evaluation of left ventricular function by transthoracic echocardiography. Would recommend an alternative means of evaluation. No measurements or calculations were performed on  this study. FINDINGS  Left Ventricle: TDS. Left ventricular ejection fraction, by estimation, is 30 to 35%. The left ventricle has moderately decreased function. The left ventricle demonstrates regional wall motion abnormalities. Definity contrast agent was given IV to delineate the left ventricular endocardial borders. The left ventricular internal cavity size was moderately dilated. There is no left ventricular hypertrophy. Left ventricular diastolic function could not be evaluated. Right Ventricle: TDS. The right ventricular size is moderately enlarged. No increase in right ventricular wall thickness. Right ventricular systolic function was not well visualized. Left Atrium: TDS. Left atrial size was not well visualized. Right Atrium: Right atrial size was not well visualized. Pericardium: The pericardium was not well visualized. Mitral Valve: The mitral valve was not well visualized. TDS mitral valve regurgitation. Tricuspid Valve: The tricuspid valve is not well visualized. Tricuspid valve regurgitation TDS. Aortic Valve: The aortic valve was not well visualized. Aortic valve regurgitation TDS. Pulmonic Valve: The pulmonic valve was not well visualized. Pulmonic valve regurgitation TDS. Aorta: Aortic root could not be assessed. IAS/Shunts: The interatrial septum was not assessed. Additional Comments: Very TDS Poor windows. Probably mod reduced LVF globally EF=30-35%?Carolyn Kida MD Electronically signed by Carolyn Kida MD Signature Date/Time: 11/03/2021/11:32:17 AM    Final    CT Angio Chest PE  W/Cm &/Or Wo Cm  Result Date: 11/02/2021 CLINICAL DATA:  Fever, cough, shortness of breath x2 days. Tachycardia. Hypoxia. EXAM: CT ANGIOGRAPHY CHEST WITH CONTRAST TECHNIQUE: Multidetector CT imaging of the chest was performed using the standard protocol during bolus administration of intravenous contrast. Multiplanar CT image reconstructions and MIPs were obtained to evaluate the vascular anatomy. RADIATION DOSE REDUCTION: This exam was performed according to the departmental dose-optimization program which includes automated exposure control, adjustment of the mA and/or kV according to patient size and/or use of iterative reconstruction technique. CONTRAST:  59m OMNIPAQUE IOHEXOL 350 MG/ML SOLN COMPARISON:  Chest radiograph dated 11/02/2021. FINDINGS: Cardiovascular: Suboptimal contrast opacification in the pulmonary arteries, with preferential opacification of the aorta and left heart, suggesting suboptimal bolus timing. However, despite these limitations, the study is positive for multiple segmental and subsegmental pulmonary emboli in the left upper lobe (series 7/image 115), right upper lobe (series 7/images 93, 102, 111, 116, and 126), and in the left lower lobe and lingula (series 7/image 162). Overall clot burden is moderate. Enlargement of the main pulmonary artery, suggesting pulmonary arterial hypertension. Mildly enlarged RV to LV ratio, 1.08. However, given the lack central pulmonary embolism (lobar or above), this is not considered specific for right heart strain. No evidence of thoracic aortic aneurysm or dissection. Cardiomegaly.  No pericardial effusion. Three vessel coronary atherosclerosis. Mediastinum/Nodes: No suspicious mediastinal lymphadenopathy. Visualized thyroid is unremarkable. Lungs/Pleura: Eventration right hemidiaphragm with associated right basilar scarring/atelectasis. Volume loss/atelectasis in the right middle lobe, chronic. Mild ground-glass opacity/mosaic attenuation in the  lungs bilaterally, chronic. No findings suspicious for pulmonary infarct. No suspicious pulmonary nodules. No pleural effusion or pneumothorax. Upper Abdomen: Visualized upper abdomen is notable for prior cholecystectomy. Musculoskeletal: Degenerative changes of the visualized thoracolumbar spine. Review of the MIP images confirms the above findings. IMPRESSION: Multiple segmental and subsegmental bilateral pulmonary emboli, as above. Overall clot burden is moderate. Critical Value/emergent results were called by telephone at the time of interpretation on 11/02/2021 at 5:42 pm to provider [facility], who verbally acknowledged these results. Electronically Signed   By: SJulian HyM.D.   On: 11/02/2021 17:47   DG Chest 2 View  Result Date: 11/02/2021 CLINICAL  DATA:  Provided history: Cough, shortness of breath. Fever for 2 days. Heart rate elevation. EXAM: CHEST - 2 VIEW COMPARISON:  CT angiogram chest 04/27/2021. Prior chest radiographs 04/27/2021. FINDINGS: Heart size within normal limits. Chronic elevation of the right hemidiaphragm. Mild ill-defined opacity within the adjacent right lung base with an appearance favoring atelectasis. No definite airspace consolidation. No evidence of pleural effusion or pneumothorax. No acute bony abnormality identified. IMPRESSION: Chronic elevation of the right hemidiaphragm. Mild ill-defined opacity within the adjacent right lung base with an appearance favoring atelectasis. No definite airspace consolidation. Electronically Signed   By: Kellie Simmering D.O.   On: 11/02/2021 14:18    EKG: Cardiac nonspecific ST-T wave changes rate of 125  ASSESSMENT AND PLAN:  Possible non-STEMI Elevated troponins Pulmonary embolus Cardiomyopathy Morbid obesity Obstructive sleep apnea DM Hyperlipidemia Sepsis Baceremia Obesity Status post COVID . Plan Agree with ICU level care IV heparin therapy for anticoagulation Broad-spectrum antibiotic therapy for bacteremia  sepsis Hydration for renal insufficiency Consider midodrine to help with hypotension Possibly add pressor therapy to help with blood pressure support The diabetes management and control Consider MUGA for evaluation of left ventricular functionof less than optimal ECHO Follow-up troponins and EKGs Started low-dose beta-blockade or Cardizem therapy for tachycardia Continue CPAP for obstructive sleep apnea recommend weight loss Would defer invasive cardiac cath or intervention because of bacteremia Systemic thrombolytics for cardiac non-STEMI there is no evidence of a STEMI     Signed: Yolonda Kida MD 11/03/2021, 11:34 AM

## 2021-11-03 NOTE — Progress Notes (Signed)
Progress Note   Patient: Carolyn Levine TIR:[id number] DOB: 1960/12/27 DOA: 11/02/2021     1 DOS: the patient was seen and examined on 11/03/2021   Brief hospital course: Taken from H&P.  Carolyn Levine is a 61 y.o. female with medical history significant of  HTN, DMII,HLD, obesity ,OSA on cpap, qhs basal cell cancer who presents to ED with1- 2 days of sob,and cough. Patient states over the last week she has had increase nausea no emesis. She notes she related this to recently starting Adipex. However yesterday she started to experience sob and cough that was persistent and above her usual baseline sob.  Patient notes since she had COVID 2/23 she has had mild DOE at baseline.  Patient also noted chills and palpitations associated with her symptoms. She however denies chest pain, fever/ presyncope, abdominal pain, leg pain or increase leg swelling from baseline.   ED course.  On arrival patient was tachycardic, tachypneic, borderline blood pressure and mildly hypoxic requiring up to 2 L of oxygen.  Labs pertinent for significant leukocytosis with WBC of 38.2, hemoglobin of 11.7, AKI with BUN of 28 and creatinine of 1.63 with baseline less than 1.  T. bili started trending up at 1.4. Lactic acid of 3.3, procalcitonin at 48.56, troponin 727>>1088, and BNP of 534.  D-dimer of 15.58 UA with protein urea and large leukocytes along with many bacteria.  Urine cultures pending. Chest x-ray with chronic elevation of left hemidiaphragm.  Mild ill-defined opacity within the adjacent right lung base with an appearance of favoring atelectasis and no definite airspace consolidation. CTA chest with multiple segmental and subsegmental bilateral PE, overall clot burden is moderate.  Mildly enlarged RV to LV ratio, 1.08, may be mild right heart strain, not considered specific due to lack of central pulmonary embolism. Patient was started on heparin infusion and vascular surgery was consulted who will see the patient in  the morning. She was started on ceftriaxone for concern of UTI.  8/26: Patient remained quite tachycardic and tachypneic.  1 episode of being febrile at 100 F, remained on 2 L of oxygen. Leukocytosis improving, significant drop in platelet count 130 from 214, some improvement in creatinine to 1.35 with getting some IV fluid.  Preliminary blood cultures negative, urine cultures pending. Clinically undetermined sepsis due to confounding factor of PE. Patient do have leukocytosis, tachycardia, tachypnea, meeting criteria for severe sepsis with AKI and lactic acidosis, she also has a confounding factor of pulmonary embolism which can be presented same way.  If urine culture is positive then she can be declared both as septic and having pulmonary embolism. Worsening troponin, echocardiogram with low EF and wall motion abnormalities, difficult films to get a proper read per cardiology, most likely had NSTEMI. Lactic acidosis resolved Blood cultures with Enterobacter fascia species, no further characterization yet, antibiotics broadened to cefepime to cover Citrobacter freundii-we will de-escalate once more culture data is available. Called interventional radiology at [facility], spoke with Dr. Daryll Brod who reviewed the films and does not think that she need any thrombectomies as most of her clots are in the periphery, no central clotting.  Films were more consistent with pulmonary hypertension. Case also discussed with Dr. Clayborn Bigness from cardiology and they will not be do any catheterization until her bacteremia clears off. We will continue heparin infusion for now. Can Try low-dose beta-blocker with midodrine. Right and left cardiac catheterization most likely sometime next week once bacteremia clears off. Repeat blood cultures on Monday.  Patient is a very complicated lady, high risk for deterioration. Also consulted PCCM.   Assessment and Plan: * Pulmonary emboli (HCC) Patient with bilateral  segmental and subsegmental PE, per interventional radiology not very significant clot burden, no thrombectomy indicated.  They do not think that she has any right heart strain based on this clot burden.  Imaging with concern of pulmonary hypertension.  Saturating well on 2 L of oxygen. Vascular surgery and IR was consulted. Vascular surgery coverage over the weekend does not treat pulmonary embolisms. -Continue with heparin infusion -Continue supportive care  Severe sepsis (HCC) Patient met sepsis criteria with fever, heart leukocytosis, tachycardia and tachypnea.  Severe sepsis with evidence of endorgan compromise with AKI and lactic acidosis. Preliminary 4/4 blood cultures growing Enterobacter  species-no further characterization yet. -Broaden up coverage to cefepime to cover Citrobacter fundi-we will de-escalate once more culture data is available. Continue with supportive care -Did receive some IV fluid but should be careful based on new echo results of HFrEF.  NSTEMI (non-ST elevated myocardial infarction) (HCC) Troponin continued to rise, at 2773 which makes it in the range of NSTEMI. Echocardiogram with concern of regional wall motion abnormalities. Cardiology is on board but they will not be performing any cardiac catheterization until bacteremia improves. -Continue with IV heparin infusion. -Add low-dose metoprolol -Continue home dose of statin  Acute HFrEF (heart failure with reduced ejection fraction) (HCC) Echocardiogram with EF of 30 to 35% and regional wall motion abnormalities.  Dilated right ventricle.  Per cardiology very difficult window and pictures to comment on. Received IV fluid and will be high risk for further becoming volume overloaded but blood pressure soft and need fluid for sepsis and AKI. -Monitor volume status very closely -Will need right and left cardiac catheterization once bacteremia improves.   AKI (acute kidney injury) (HCC) Most likely secondary to  sepsis. Some improvement to creatinine at 1.35 today, baseline less than 1. -Continue with gentle IV hydration -Monitor renal function Avoid nephrotoxins-  Type 2 diabetes mellitus with hyperlipidemia (HCC) Patient was on metformin and Actos at home. -Moderate scale SSI  Hyperlipidemia, unspecified - Continue home Crestor  Hypertension Blood pressure currently soft. Holding home lisinopril for softer blood pressure and AKI. -Continue to monitor  Morbid obesity with BMI of 50.0-59.9, adult (HCC) Estimated body mass index is 56.75 kg/m as calculated from the following:   Height as of this encounter: 5' 5" (1.651 m).   Weight as of this encounter: 154.7 kg.   -This will complicate overall prognosis.   Subjective: Patient was seen and examined today.  She was quite tachycardic and tachypneic, appears very lethargic.  Denies any chest pain.  Continue to have shortness of breath.  Physical Exam: Vitals:   11/03/21 0400 11/03/21 0500 11/03/21 0600 11/03/21 0800  BP: (!) 125/59 115/65 (!) 111/56   Pulse: (!) 130 (!) 135 (!) 130   Resp: (!) 37 (!) 24 (!) 31   Temp: 98.2 F (36.8 C)   98.5 F (36.9 C)  TempSrc: Oral   Oral  SpO2: 100% 99% 96%   Weight:      Height:       General.  Morbidly obese, lethargic lady, some increased work of breathing Pulmonary.  Lungs clear bilaterally, increased work of breathing CV.  Tachycardia Abdomen.  Soft, nontender, nondistended, BS positive. CNS.  Alert and oriented .  No focal neurologic deficit. Extremities.  1+ LE edema, no cyanosis, pulses intact and symmetrical. Psychiatry.  Judgment and insight appears   normal.  Data Reviewed: Prior data reviewed.  Family Communication: Discussed with 2 sisters at bedside.  Disposition: Status is: Inpatient Remains inpatient appropriate because: Severity of illness,   Planned Discharge Destination: Home  DVT prophylaxis.  Heparin infusion Time spent: 55 minutes  This record has been  created using Dragon voice recognition software. Errors have been sought and corrected,but may not always be located. Such creation errors do not reflect on the standard of care.  Author: Sumayya Amin, MD 11/03/2021 2:19 PM  For on call review [email or web address].  

## 2021-11-03 NOTE — Assessment & Plan Note (Signed)
Patient met sepsis criteria with fever, heart leukocytosis, tachycardia and tachypnea.  Severe sepsis with evidence of endorgan compromise with AKI and lactic acidosis. Preliminary 4/4 blood cultures growing Enterobacter  species-no further characterization yet. -Broaden up coverage to cefepime to cover Citrobacter fundi-we will de-escalate once more culture data is available. Continue with supportive care -Did receive some IV fluid but should be careful based on new echo results of HFrEF.

## 2021-11-03 NOTE — Progress Notes (Signed)
Attempted to get report from ER nurse, she stated she would call me back when she got back to the ER.

## 2021-11-03 NOTE — Assessment & Plan Note (Signed)
Echocardiogram with EF of 30 to 35% and regional wall motion abnormalities.  Dilated right ventricle.  Per cardiology very difficult window and pictures to comment on. Received IV fluid and will be high risk for further becoming volume overloaded but blood pressure soft and need fluid for sepsis and AKI. -Monitor volume status very closely -Will need right and left cardiac catheterization once bacteremia improves.

## 2021-11-03 NOTE — Assessment & Plan Note (Signed)
Patient with bilateral segmental and subsegmental PE, per interventional radiology not very significant clot burden, no thrombectomy indicated.  They do not think that she has any right heart strain based on this clot burden.  Imaging with concern of pulmonary hypertension.  Saturating well on 2 L of oxygen. Vascular surgery and IR was consulted. Vascular surgery coverage over the weekend does not treat pulmonary embolisms. -Continue with heparin infusion -Continue supportive care

## 2021-11-03 NOTE — Assessment & Plan Note (Signed)
Blood pressure currently soft. Holding home lisinopril for softer blood pressure and AKI. -Continue to monitor

## 2021-11-03 NOTE — Assessment & Plan Note (Signed)
Troponin continued to rise, at 2773 which makes it in the range of NSTEMI. Echocardiogram with concern of regional wall motion abnormalities. Cardiology is on board but they will not be performing any cardiac catheterization until bacteremia improves. -Continue with IV heparin infusion. -Add low-dose metoprolol -Continue home dose of statin

## 2021-11-03 NOTE — Assessment & Plan Note (Signed)
Estimated body mass index is 56.75 kg/m as calculated from the following:   Height as of this encounter: '5\' 5"'$  (1.651 m).   Weight as of this encounter: 154.7 kg.   -This will complicate overall prognosis.

## 2021-11-03 NOTE — Progress Notes (Signed)
      Daily Progress Note   I was added again to the conversation on this patient.  Again, I will note I do NOT manage pulmonary embolism in my primary practice, so I unfortuantely do not have much to add to this patient's management.   Adele Barthel, MD, FACS, FSVS Covering for [facility]: [phone]  11/03/2021, 1:38 PM

## 2021-11-03 NOTE — Progress Notes (Signed)
Case reviewed and discussed with nursing and Hospitalist.   Impression  Probable NSTEMI  Acute PE Bacteremia Sepsis Obesity  Probably OSA Cardiomyopathy  Pulmonary HTN  . Continue ICU level care Agree with heparin IV Continue broad spectrum antibiotics  Recommend midodrine for hypotension  Consider pressor therapy for Bp support  Consider MUGA for EF Defer cardiac Cath/Intervention b ecause of bacteremia I don't recommend peripheral thrombolytics  I will be at the bedside to examine and complete my full consultation

## 2021-11-03 NOTE — Hospital Course (Addendum)
Taken from H&P.  Carolyn Levine is a 61 y.o. female with medical history significant of  HTN, DMII,HLD, obesity ,OSA on cpap, qhs basal cell cancer who presents to ED with1- 2 days of sob,and cough. Patient states over the last week she has had increase nausea no emesis. She notes she related this to recently starting Adipex. However yesterday she started to experience sob and cough that was persistent and above her usual baseline sob.  Patient notes since she had COVID 2/23 she has had mild DOE at baseline.  Patient also noted chills and palpitations associated with her symptoms. She however denies chest pain, fever/ presyncope, abdominal pain, leg pain or increase leg swelling from baseline.   ED course.  On arrival patient was tachycardic, tachypneic, borderline blood pressure and mildly hypoxic requiring up to 2 L of oxygen.  Labs pertinent for significant leukocytosis with WBC of 38.2, hemoglobin of 11.7, AKI with BUN of 28 and creatinine of 1.63 with baseline less than 1.  T. bili started trending up at 1.4. Lactic acid of 3.3, procalcitonin at 48.56, troponin 727>>1088, and BNP of 534.  D-dimer of 15.58 UA with protein urea and large leukocytes along with many bacteria.  Urine cultures pending. Chest x-ray with chronic elevation of left hemidiaphragm.  Mild ill-defined opacity within the adjacent right lung base with an appearance of favoring atelectasis and no definite airspace consolidation. CTA chest with multiple segmental and subsegmental bilateral PE, overall clot burden is moderate.  Mildly enlarged RV to LV ratio, 1.08, may be mild right heart strain, not considered specific due to lack of central pulmonary embolism. Patient was started on heparin infusion and vascular surgery was consulted who will see the patient in the morning. She was started on ceftriaxone for concern of UTI.  8/26: Patient remained quite tachycardic and tachypneic.  1 episode of being febrile at 100 F, remained on  2 L of oxygen. Leukocytosis improving, significant drop in platelet count 130 from 214, some improvement in creatinine to 1.35 with getting some IV fluid.  Preliminary blood cultures negative, urine cultures pending. Clinically undetermined sepsis due to confounding factor of PE. Patient do have leukocytosis, tachycardia, tachypnea, meeting criteria for severe sepsis with AKI and lactic acidosis, she also has a confounding factor of pulmonary embolism which can be presented same way.  If urine culture is positive then she can be declared both as septic and having pulmonary embolism. Worsening troponin, echocardiogram with low EF and wall motion abnormalities, difficult films to get a proper read per cardiology, most likely had NSTEMI. Lactic acidosis resolved Blood cultures with Enterobacter fascia species, no further characterization yet, antibiotics broadened to cefepime to cover Citrobacter freundii-we will de-escalate once more culture data is available. Called interventional radiology at [facility], spoke with Dr. Daryll Brod who reviewed the films and does not think that she need any thrombectomies as most of her clots are in the periphery, no central clotting.  Films were more consistent with pulmonary hypertension. Case also discussed with Dr. Clayborn Bigness from cardiology and they will not be do any catheterization until her bacteremia clears off. We will continue heparin infusion for now. Can Try low-dose beta-blocker with midodrine. Right and left cardiac catheterization most likely sometime next week once bacteremia clears off. Repeat blood cultures on Monday.  Patient is a very complicated lady, high risk for deterioration. Also consulted PCCM.

## 2021-11-03 NOTE — Progress Notes (Signed)
*  PRELIMINARY RESULTS* Echocardiogram 2D Echocardiogram has been performed. Definity IV ultrasound imaging agent used on this study.  Claretta Fraise 11/03/2021, 8:11 AM

## 2021-11-03 NOTE — Progress Notes (Signed)
ANTICOAGULATION CONSULT NOTE  Pharmacy Consult for IV heparin Indication: chest pain/ACS/STEMI and bilateral pulmonary emboli  Allergies  Allergen Reactions   Codeine Sulfate    Simvastatin     Other reaction(s): Muscle Pain   Sulphadimidine [Sulfamethazine]    Patient Measurements: Height: '5\' 5"'$  (165.1 cm) Weight: (!) 154.7 kg (341 lb) IBW/kg (Calculated) : 57 Heparin Dosing Weight: 96.3 kg  Vital Signs: Temp: 98.5 F (36.9 C) (08/26 0800) Temp Source: Oral (08/26 0800) BP: 111/56 (08/26 0600) Pulse Rate: 130 (08/26 0600)  Labs: Recent Labs    11/02/21 1531 11/02/21 2104 11/03/21 0454 11/03/21 0828 11/03/21 1032 11/03/21 1227  HGB 11.7*  --  11.2*  --   --   --   HCT 36.8  --  35.1*  --   --   --   PLT 214  --  130*  --   --  133*  APTT 33  --   --   --   --   --   LABPROT 14.7  --   --   --   --   --   INR 1.2  --   --   --   --   --   HEPARINUNFRC  --  0.58 0.22*  --   --  0.26*  CREATININE 1.63* 1.60* 1.35*  --   --   --   TROPONINIHS 1,088*  727*  --   --  2,779* 2,773*  --      Estimated Creatinine Clearance: 65.5 mL/min (A) (by C-G formula based on SCr of 1.35 mg/dL (H)).   Medical History: Past Medical History:  Diagnosis Date   Allergy    Cancer ([place])    basal cell skin ca   Diabetes mellitus without complication ([place])    Hyperlipidemia    Hypertension    Rosacea     Medications:  Medications Prior to Admission  Medication Sig Dispense Refill Last Dose   lisinopril (ZESTRIL) 20 MG tablet Take 1 tablet (20 mg total) by mouth once daily 90 tablet 4 Past Week   metFORMIN (GLUCOPHAGE) 1000 MG tablet Take 1 tablet (1,000 mg total) by mouth 2 (two) times daily 180 tablet 3 Past Week   phentermine (ADIPEX-P) 37.5 MG tablet Take 1 tablet (37.5 mg total) by mouth every morning before breakfast for 120 days 30 tablet 3 Past Week   pioglitazone (ACTOS) 30 MG tablet TAKE 1 TABLET BY MOUTH ONCE DAILY 90 tablet 3 Past Week   rosuvastatin (CRESTOR) 5 MG  tablet Take 1 tablet (5 mg total) by mouth once daily 90 tablet 3 Past Week   traZODone (DESYREL) 50 MG tablet Take 1 tablet (50 mg total) by mouth at bedtime 90 tablet 3 Past Week at 2200   Scheduled:   Chlorhexidine Gluconate Cloth  6 each Topical Daily   insulin aspart  0-6 Units Subcutaneous TID WC   Infusions:   sodium chloride     ceFEPime (MAXIPIME) IV     heparin 1,450 Units/hr (11/03/21 0813)   PRN: acetaminophen **OR** acetaminophen, benzonatate, guaiFENesin, guaiFENesin-dextromethorphan, ipratropium, ondansetron **OR** ondansetron (ZOFRAN) IV Anti-infectives (From admission, onward)    Start     Dose/Rate Route Frequency Ordered Stop   11/03/21 2200  cefTRIAXone (ROCEPHIN) 2 g in sodium chloride 0.9 % 100 mL IVPB  Status:  Discontinued        2 g 200 mL/hr over 30 Minutes Intravenous Every 24 hours 11/03/21 0752 11/03/21 1158   11/03/21 1400  ceFEPIme (  MAXIPIME) 2 g in sodium chloride 0.9 % 100 mL IVPB        2 g 200 mL/hr over 30 Minutes Intravenous Every 8 hours 11/03/21 1158     11/02/21 2215  cefTRIAXone (ROCEPHIN) 1 g in sodium chloride 0.9 % 100 mL IVPB  Status:  Discontinued        1 g 200 mL/hr over 30 Minutes Intravenous Every 24 hours 11/02/21 2204 11/03/21 0752       Assessment: Pt is a 61 yo F with PMH HTN, T2DM, HLD, obesity, OSA on CPAP, basal cell cancer. Pt presented with 2-day h/o cough and shortness of breath. CT identified multiple segmental and subsegmental bilateral pulmonary emboli, as above. Troponins elevated at presentation and remain elevated at 2,713. ECG pending. Pt endorses increasing nausea over the past week and also notes that she had COVID in Feb 2023 and since then has had DOE at baseline. Bcx collected, growing GNRs (Enterobacterales, speciation not identified by BCID) in 4/4 bottles. PLT dropped from 214 >> 133 after starting heparin but since then have stabilized. Pt not any anticoagulation PTA.  Goal of Therapy:  Heparin level 0.3-0.7  units/ml Monitor platelets by anticoagulation protocol: Yes   Date Time HL Rate/Comment 8/26 0454 0.22 Subtherapeutic x 1 8/26 1227 0.26 Subtherapeutic x 2  Plan:  Bolus 1400 units x 1 Increase heparin infusion to 1600 units/hr Recheck HL in 6 hrs after rate change Monitor CBC daily while on heparin, especially PLT in this patient given initial downtrend following starting heparin  Dara Hoyer, PharmD PGY-1 Pharmacy Resident 11/03/2021 2:20 PM

## 2021-11-03 NOTE — ED Notes (Addendum)
Pt c/o cough at this time. Pt requesting cough medication.

## 2021-11-03 NOTE — Progress Notes (Signed)
PHARMACY - PHYSICIAN COMMUNICATION CRITICAL VALUE ALERT - BLOOD CULTURE IDENTIFICATION (BCID)  Carolyn Levine is an 61 y.o. female who presented to [facility] on 11/02/2021 with a chief complaint of 2-day h/o cough and SOB.   Assessment: 4 of 4, Gram stain shows GNRs. BCID shows Enterobacterales (no species), No resistance genes detected.  Name of physician (or Provider) Contacted: Lorella Nimrod  Current antibiotics: Ceftriaxone 2 g IV q24H  Changes to prescribed antibiotics recommended:  Change from ceftriaxone to cefepime 2 g IV q8H.  Recommendation made was due to possibility that the pathogen is Citrobacter freundii, which is known to exhibit inducible resistance when exposed to ceftriaxone and BCID does not detect.  Results for orders placed or performed during the hospital encounter of 11/02/21  Blood Culture ID Panel (Reflexed) (Collected: 11/02/2021 10:26 PM)  Result Value Ref Range   Enterococcus faecalis NOT DETECTED NOT DETECTED   Enterococcus Faecium NOT DETECTED NOT DETECTED   Listeria monocytogenes NOT DETECTED NOT DETECTED   Staphylococcus species NOT DETECTED NOT DETECTED   Staphylococcus aureus (BCID) NOT DETECTED NOT DETECTED   Staphylococcus epidermidis NOT DETECTED NOT DETECTED   Staphylococcus lugdunensis NOT DETECTED NOT DETECTED   Streptococcus species NOT DETECTED NOT DETECTED   Streptococcus agalactiae NOT DETECTED NOT DETECTED   Streptococcus pneumoniae NOT DETECTED NOT DETECTED   Streptococcus pyogenes NOT DETECTED NOT DETECTED   A.calcoaceticus-baumannii NOT DETECTED NOT DETECTED   Bacteroides fragilis NOT DETECTED NOT DETECTED   Enterobacterales DETECTED (A) NOT DETECTED   Enterobacter cloacae complex NOT DETECTED NOT DETECTED   Escherichia coli NOT DETECTED NOT DETECTED   Klebsiella aerogenes NOT DETECTED NOT DETECTED   Klebsiella oxytoca NOT DETECTED NOT DETECTED   Klebsiella pneumoniae NOT DETECTED NOT DETECTED   Proteus species NOT DETECTED NOT  DETECTED   Salmonella species NOT DETECTED NOT DETECTED   Serratia marcescens NOT DETECTED NOT DETECTED   Haemophilus influenzae NOT DETECTED NOT DETECTED   Neisseria meningitidis NOT DETECTED NOT DETECTED   Pseudomonas aeruginosa NOT DETECTED NOT DETECTED   Stenotrophomonas maltophilia NOT DETECTED NOT DETECTED   Candida albicans NOT DETECTED NOT DETECTED   Candida auris NOT DETECTED NOT DETECTED   Candida glabrata NOT DETECTED NOT DETECTED   Candida krusei NOT DETECTED NOT DETECTED   Candida parapsilosis NOT DETECTED NOT DETECTED   Candida tropicalis NOT DETECTED NOT DETECTED   Cryptococcus neoformans/gattii NOT DETECTED NOT DETECTED   CTX-M ESBL NOT DETECTED NOT DETECTED   Carbapenem resistance IMP NOT DETECTED NOT DETECTED   Carbapenem resistance KPC NOT DETECTED NOT DETECTED   Carbapenem resistance NDM NOT DETECTED NOT DETECTED   Carbapenem resist OXA 48 LIKE NOT DETECTED NOT DETECTED   Carbapenem resistance VIM NOT DETECTED NOT DETECTED   Dara Hoyer, PharmD PGY-1 Pharmacy Resident 11/03/2021 11:45 AM

## 2021-11-03 NOTE — Assessment & Plan Note (Signed)
-   Continue home Crestor °

## 2021-11-03 NOTE — Progress Notes (Signed)
Per patient, spouse took home purse and all valuables besides clothes and cell phone

## 2021-11-03 NOTE — Progress Notes (Signed)
[place] for IV heparin Indication: chest pain/ACS / STEMI  Allergies  Allergen Reactions   Codeine Sulfate    Simvastatin     Other reaction(s): Muscle Pain   Sulphadimidine [Sulfamethazine]     Patient Measurements: Height: '5\' 5"'$  (165.1 cm) Weight: (!) 154.7 kg (341 lb) IBW/kg (Calculated) : 57 Heparin Dosing Weight: 96.3 kg  Vital Signs: Temp: 100 F (37.8 C) (08/26 0021) Temp Source: Oral (08/26 0021) BP: 125/59 (08/26 0400) Pulse Rate: 130 (08/26 0400)  Labs: Recent Labs    11/02/21 1531 11/02/21 2104 11/03/21 0454  HGB 11.7*  --  11.2*  HCT 36.8  --  35.1*  PLT 214  --  130*  APTT 33  --   --   LABPROT 14.7  --   --   INR 1.2  --   --   HEPARINUNFRC  --  0.58 0.22*  CREATININE 1.63* 1.60* 1.35*  TROPONINIHS 1,088*  727*  --   --      Estimated Creatinine Clearance: 65.5 mL/min (A) (by C-G formula based on SCr of 1.35 mg/dL (H)).   Medical History: Past Medical History:  Diagnosis Date   Allergy    Cancer ([place])    basal cell skin ca   Diabetes mellitus without complication (HCC)    Hyperlipidemia    Hypertension    Rosacea     Medications:  Medications Prior to Admission  Medication Sig Dispense Refill Last Dose   lisinopril (ZESTRIL) 20 MG tablet Take 1 tablet (20 mg total) by mouth once daily 90 tablet 4 Past Week   metFORMIN (GLUCOPHAGE) 1000 MG tablet Take 1 tablet (1,000 mg total) by mouth 2 (two) times daily 180 tablet 3 Past Week   phentermine (ADIPEX-P) 37.5 MG tablet Take 1 tablet (37.5 mg total) by mouth every morning before breakfast for 120 days 30 tablet 3 Past Week   pioglitazone (ACTOS) 30 MG tablet TAKE 1 TABLET BY MOUTH ONCE DAILY 90 tablet 3 Past Week   rosuvastatin (CRESTOR) 5 MG tablet Take 1 tablet (5 mg total) by mouth once daily 90 tablet 3 Past Week   traZODone (DESYREL) 50 MG tablet Take 1 tablet (50 mg total) by mouth at bedtime 90 tablet 3 Past Week at 2200   Scheduled:    Chlorhexidine Gluconate Cloth  6 each Topical Daily   insulin aspart  0-6 Units Subcutaneous TID WC   Infusions:   sodium chloride     cefTRIAXone (ROCEPHIN)  IV Stopped (11/03/21 0114)   heparin 1,300 Units/hr (11/03/21 0200)   PRN: acetaminophen **OR** acetaminophen, guaiFENesin, guaiFENesin-dextromethorphan, ipratropium, ondansetron **OR** ondansetron (ZOFRAN) IV Anti-infectives (From admission, onward)    Start     Dose/Rate Route Frequency Ordered Stop   11/02/21 2215  cefTRIAXone (ROCEPHIN) 1 g in sodium chloride 0.9 % 100 mL IVPB        1 g 200 mL/hr over 30 Minutes Intravenous Every 24 hours 11/02/21 2204        Not on PTA anticoagulation per my chart review  Assessment: 61 year old female presenting with shortness of breath and tachycardia. Past medical history includes diabetes, hypertension, hyperlipidemia.  Troponins elevated and trending up on repeat (> 1,000).  Baseline CBC acceptable. Baseline aPTT 33.   Goal of Therapy:  Heparin level 0.3-0.7 units/ml Monitor platelets by anticoagulation protocol: Yes   8/26 0454 HL 0.22 subtherapeutic x 1  Plts 130 down from BL 214  Plan:  Bolus 1400 units x  1 Increase heparin infusion to 1450 units/hr Recheck HL & Plt in 6 hrs after rate change CBC daily while on heparin  Renda Rolls, PharmD, [facility] 11/03/2021 5:48 AM

## 2021-11-04 DIAGNOSIS — I2699 Other pulmonary embolism without acute cor pulmonale: Secondary | ICD-10-CM | POA: Diagnosis not present

## 2021-11-04 DIAGNOSIS — E785 Hyperlipidemia, unspecified: Secondary | ICD-10-CM | POA: Diagnosis not present

## 2021-11-04 DIAGNOSIS — N179 Acute kidney failure, unspecified: Secondary | ICD-10-CM | POA: Diagnosis not present

## 2021-11-04 DIAGNOSIS — I5021 Acute systolic (congestive) heart failure: Secondary | ICD-10-CM | POA: Diagnosis not present

## 2021-11-04 LAB — HEPARIN LEVEL (UNFRACTIONATED)
Heparin Unfractionated: 0.26 IU/mL — ABNORMAL LOW (ref 0.30–0.70)
Heparin Unfractionated: 0.19 IU/mL — ABNORMAL LOW (ref 0.30–0.70)
Heparin Unfractionated: 0.26 IU/mL — ABNORMAL LOW (ref 0.30–0.70)

## 2021-11-04 LAB — TROPONIN I (HIGH SENSITIVITY): Troponin I (High Sensitivity): 1698 ng/L (ref <18)

## 2021-11-04 LAB — COMPREHENSIVE METABOLIC PANEL
ALT: 22 U/L (ref 0–44)
AST: 30 U/L (ref 15–41)
Albumin: 2.9 g/dL — ABNORMAL LOW (ref 3.5–5.0)
Alkaline Phosphatase: 75 U/L (ref 38–126)
Anion gap: 8 (ref 5–15)
BUN: 42 mg/dL — ABNORMAL HIGH (ref 8–23)
CO2: 22 mmol/L (ref 22–32)
Calcium: 7.9 mg/dL — ABNORMAL LOW (ref 8.9–10.3)
Chloride: 106 mmol/L (ref 98–111)
Creatinine, Ser: 1.37 mg/dL — ABNORMAL HIGH (ref 0.44–1.00)
GFR, Estimated: 44 mL/min — ABNORMAL LOW (ref >60)
Glucose, Bld: 138 mg/dL — ABNORMAL HIGH (ref 70–99)
Potassium: 3.8 mmol/L (ref 3.5–5.1)
Sodium: 136 mmol/L (ref 135–145)
Total Bilirubin: 1.4 mg/dL — ABNORMAL HIGH (ref 0.3–1.2)
Total Protein: 7.2 g/dL (ref 6.5–8.1)

## 2021-11-04 LAB — GLUCOSE, CAPILLARY
Glucose-Capillary: 120 mg/dL — ABNORMAL HIGH (ref 70–99)
Glucose-Capillary: 126 mg/dL — ABNORMAL HIGH (ref 70–99)
Glucose-Capillary: 120 mg/dL — ABNORMAL HIGH (ref 70–99)
Glucose-Capillary: 113 mg/dL — ABNORMAL HIGH (ref 70–99)
Glucose-Capillary: 111 mg/dL — ABNORMAL HIGH (ref 70–99)

## 2021-11-04 LAB — CBC
HCT: 33.7 % — ABNORMAL LOW (ref 36.0–46.0)
Hemoglobin: 10.8 g/dL — ABNORMAL LOW (ref 12.0–15.0)
MCH: 28.6 pg (ref 26.0–34.0)
MCHC: 32 g/dL (ref 30.0–36.0)
MCV: 89.4 fL (ref 80.0–100.0)
Platelets: 136 10*3/uL — ABNORMAL LOW (ref 150–400)
RBC: 3.77 MIL/uL — ABNORMAL LOW (ref 3.87–5.11)
RDW: 13.7 % (ref 11.5–15.5)
WBC: 22.3 10*3/uL — ABNORMAL HIGH (ref 4.0–10.5)
nRBC: 0 % (ref 0.0–0.2)

## 2021-11-04 MED ORDER — HEPARIN BOLUS VIA INFUSION
3000.0000 [IU] | Freq: Once | INTRAVENOUS | Status: AC
  Administered 2021-11-04: 3000 [IU] via INTRAVENOUS
  Filled 2021-11-04: qty 3000

## 2021-11-04 MED ORDER — HEPARIN BOLUS VIA INFUSION
1400.0000 [IU] | Freq: Once | INTRAVENOUS | Status: AC
  Administered 2021-11-04: 1400 [IU] via INTRAVENOUS
  Filled 2021-11-04: qty 1400

## 2021-11-04 MED ORDER — HEPARIN BOLUS VIA INFUSION
2500.0000 [IU] | Freq: Once | INTRAVENOUS | Status: AC
Start: 2021-11-04 — End: 2021-11-04
  Administered 2021-11-04: 2500 [IU] via INTRAVENOUS
  Filled 2021-11-04: qty 2500

## 2021-11-04 NOTE — Assessment & Plan Note (Signed)
Patient met sepsis criteria with fever, heart leukocytosis, tachycardia and tachypnea.  Severe sepsis with evidence of endorgan compromise with AKI and lactic acidosis. Preliminary 4/4 blood  and urine culture growing KLEBSIELLA ORNITHINOLYTICA-pending susceptibility -continue with cefepime Continue with supportive care -Did receive some IV fluid but should be careful based on new echo results of HFrEF.

## 2021-11-04 NOTE — Assessment & Plan Note (Signed)
Troponin peaked at 2905, started trending down.  No chest pain. Echocardiogram with concern of regional wall motion abnormalities. Cardiology is on board but they will not be performing any cardiac catheterization until bacteremia improves. -Continue with IV heparin infusion. -Add low-dose metoprolol -Continue home dose of statin

## 2021-11-04 NOTE — Assessment & Plan Note (Signed)
Patient with bilateral segmental and subsegmental PE, per interventional radiology not very significant clot burden, no thrombectomy indicated.  They do not think that she has any right heart strain based on this clot burden.  Imaging with concern of pulmonary hypertension.  Saturating well on 2 L of oxygen. Vascular surgery and IR was consulted. Vascular surgery coverage over the weekend does not treat pulmonary embolisms. -Continue with heparin infusion -Continue supportive care

## 2021-11-04 NOTE — Progress Notes (Deleted)
[facility] Cardiology    SUBJECTIVE: Patient states he been reasonably well no worsening shortness of breath no worsening leg edema no significant fever chills or sweats   Vitals:   11/04/21 0500 11/04/21 0600 11/04/21 0635 11/04/21 0700  BP:   (!) 118/54 (!) 131/115  Pulse: (!) 120 (!) 114 (!) 114 (!) 111  Resp: (!) 22 (!) 23 (!) 24 (!) 22  Temp:    98.3 F (36.8 C)  TempSrc:    Oral  SpO2: 96% 100% 99% 100%  Weight:      Height:         Intake/Output Summary (Last 24 hours) at 11/04/2021 1016 Last data filed at 11/04/2021 0800 Gross per 24 hour  Intake 748.48 ml  Output 450 ml  Net 298.48 ml      PHYSICAL EXAM  General: Well developed, well nourished, in no acute distress HEENT:  Normocephalic and atramatic Neck:  No JVD.  Lungs: Clear bilaterally to auscultation and percussion. Heart: HRRR . Normal S1 and S2 without gallops or murmurs.  Abdomen: Bowel sounds are positive, abdomen soft and non-tender  Msk:  Back normal, normal gait. Normal strength and tone for age. Extremities: No clubbing, cyanosis or edema.   Neuro: Alert and oriented X 3. Psych:  Good affect, responds appropriately   LABS: Basic Metabolic Panel: Recent Labs    11/03/21 0454 11/04/21 0333  NA 136 136  K 3.9 3.8  CL 107 106  CO2 23 22  GLUCOSE 159* 138*  BUN 32* 42*  CREATININE 1.35* 1.37*  CALCIUM 8.0* 7.9*   Liver Function Tests: Recent Labs    11/03/21 0454 11/04/21 0333  AST 41 30  ALT 27 22  ALKPHOS 63 75  BILITOT 1.4* 1.4*  PROT 7.1 7.2  ALBUMIN 3.2* 2.9*   No results for input(s): "LIPASE", "AMYLASE" in the last 72 hours. CBC: Recent Labs    11/03/21 0454 11/03/21 1227 11/04/21 0333  WBC 25.2*  --  22.3*  HGB 11.2*  --  10.8*  HCT 35.1*  --  33.7*  MCV 90.5  --  89.4  PLT 130* 133* 136*   Cardiac Enzymes: No results for input(s): "CKTOTAL", "CKMB", "CKMBINDEX", "TROPONINI" in the last 72 hours. BNP: Invalid input(s): "POCBNP" D-Dimer: Recent Labs     11/02/21 1531  DDIMER 15.58*   Hemoglobin A1C: Recent Labs    11/02/21 1320  HGBA1C 6.3*   Fasting Lipid Panel: No results for input(s): "CHOL", "HDL", "LDLCALC", "TRIG", "CHOLHDL", "LDLDIRECT" in the last 72 hours. Thyroid Function Tests: Recent Labs    11/02/21 2104  TSH 1.289   Anemia Panel: No results for input(s): "VITAMINB12", "FOLATE", "FERRITIN", "TIBC", "IRON", "RETICCTPCT" in the last 72 hours.  ECHOCARDIOGRAM COMPLETE  Result Date: 11/03/2021    ECHOCARDIOGRAM REPORT   Patient Name:   Carolyn Levine Date of Exam: 11/03/2021 Medical Rec #:  [id number]       Height:       65.0 in Accession #:    [id number]      Weight:       341.0 lb Date of Birth:  [birth year]       BSA:          2.482 m Patient Age:    61 years        BP:           111/56 mmHg Patient Gender: F               HR:  130 bpm. Exam Location:  [facility] Procedure: 2D Echo and Intracardiac Opacification Agent Indications:     Pulmonary Embolus I26.09  History:         Patient has prior history of Echocardiogram examinations, most                  recent 04/29/2021.  Sonographer:     Kathlen Brunswick RDCS Referring Phys:  [id number] SARA-MAIZ A THOMAS Diagnosing Phys: Yolonda Kida MD  Sonographer Comments: Technically challenging study due to limited acoustic windows, no apical window, no subcostal window, patient is obese and no parasternal window. Image acquisition challenging due to patient body habitus. IMPRESSIONS  1. Probably mod reduced LVF globally EF=30-35%?.  2. Very TDS Poor windows.  3. TDS. Left ventricular ejection fraction, by estimation, is 30 to 35%. The left ventricle has moderately decreased function. The left ventricle demonstrates regional wall motion abnormalities (see scoring diagram/findings for description). The left ventricular internal cavity size was moderately dilated. Left ventricular diastolic function could not be evaluated.  4. TDS. Right ventricular systolic function was not well  visualized. The right ventricular size is moderately enlarged.  5. TDS.  6. The mitral valve was not well visualized. TDS mitral valve regurgitation.  7. Tricuspid valve regurgitation TDS.  8. The aortic valve was not well visualized. Unable to determine aortic valve morphology due to image quality. Aortic valve regurgitation TDS.  9. Pulmonic valve regurgitation TDS. Conclusion(s)/Recommendation(s): Poor windows for evaluation of left ventricular function by transthoracic echocardiography. Would recommend an alternative means of evaluation. No measurements or calculations were performed on this study. FINDINGS  Left Ventricle: TDS. Left ventricular ejection fraction, by estimation, is 30 to 35%. The left ventricle has moderately decreased function. The left ventricle demonstrates regional wall motion abnormalities. Definity contrast agent was given IV to delineate the left ventricular endocardial borders. The left ventricular internal cavity size was moderately dilated. There is no left ventricular hypertrophy. Left ventricular diastolic function could not be evaluated. Right Ventricle: TDS. The right ventricular size is moderately enlarged. No increase in right ventricular wall thickness. Right ventricular systolic function was not well visualized. Left Atrium: TDS. Left atrial size was not well visualized. Right Atrium: Right atrial size was not well visualized. Pericardium: The pericardium was not well visualized. Mitral Valve: The mitral valve was not well visualized. TDS mitral valve regurgitation. Tricuspid Valve: The tricuspid valve is not well visualized. Tricuspid valve regurgitation TDS. Aortic Valve: The aortic valve was not well visualized. Aortic valve regurgitation TDS. Pulmonic Valve: The pulmonic valve was not well visualized. Pulmonic valve regurgitation TDS. Aorta: Aortic root could not be assessed. IAS/Shunts: The interatrial septum was not assessed. Additional Comments: Very TDS Poor windows.  Probably mod reduced LVF globally EF=30-35%?Yolonda Kida MD Electronically signed by Yolonda Kida MD Signature Date/Time: 11/03/2021/11:32:17 AM    Final    CT Angio Chest PE W/Cm &/Or Wo Cm  Result Date: 11/02/2021 CLINICAL DATA:  Fever, cough, shortness of breath x2 days. Tachycardia. Hypoxia. EXAM: CT ANGIOGRAPHY CHEST WITH CONTRAST TECHNIQUE: Multidetector CT imaging of the chest was performed using the standard protocol during bolus administration of intravenous contrast. Multiplanar CT image reconstructions and MIPs were obtained to evaluate the vascular anatomy. RADIATION DOSE REDUCTION: This exam was performed according to the departmental dose-optimization program which includes automated exposure control, adjustment of the mA and/or kV according to patient size and/or use of iterative reconstruction technique. CONTRAST:  74m OMNIPAQUE IOHEXOL 350 MG/ML SOLN COMPARISON:  Chest  radiograph dated 11/02/2021. FINDINGS: Cardiovascular: Suboptimal contrast opacification in the pulmonary arteries, with preferential opacification of the aorta and left heart, suggesting suboptimal bolus timing. However, despite these limitations, the study is positive for multiple segmental and subsegmental pulmonary emboli in the left upper lobe (series 7/image 115), right upper lobe (series 7/images 93, 102, 111, 116, and 126), and in the left lower lobe and lingula (series 7/image 162). Overall clot burden is moderate. Enlargement of the main pulmonary artery, suggesting pulmonary arterial hypertension. Mildly enlarged RV to LV ratio, 1.08. However, given the lack central pulmonary embolism (lobar or above), this is not considered specific for right heart strain. No evidence of thoracic aortic aneurysm or dissection. Cardiomegaly.  No pericardial effusion. Three vessel coronary atherosclerosis. Mediastinum/Nodes: No suspicious mediastinal lymphadenopathy. Visualized thyroid is unremarkable. Lungs/Pleura:  Eventration right hemidiaphragm with associated right basilar scarring/atelectasis. Volume loss/atelectasis in the right middle lobe, chronic. Mild ground-glass opacity/mosaic attenuation in the lungs bilaterally, chronic. No findings suspicious for pulmonary infarct. No suspicious pulmonary nodules. No pleural effusion or pneumothorax. Upper Abdomen: Visualized upper abdomen is notable for prior cholecystectomy. Musculoskeletal: Degenerative changes of the visualized thoracolumbar spine. Review of the MIP images confirms the above findings. IMPRESSION: Multiple segmental and subsegmental bilateral pulmonary emboli, as above. Overall clot burden is moderate. Critical Value/emergent results were called by telephone at the time of interpretation on 11/02/2021 at 5:42 pm to provider [facility] , who verbally acknowledged these results. Electronically Signed   By: Julian Hy M.D.   On: 11/02/2021 17:47   DG Chest 2 View  Result Date: 11/02/2021 CLINICAL DATA:  Provided history: Cough, shortness of breath. Fever for 2 days. Heart rate elevation. EXAM: CHEST - 2 VIEW COMPARISON:  CT angiogram chest 04/27/2021. Prior chest radiographs 04/27/2021. FINDINGS: Heart size within normal limits. Chronic elevation of the right hemidiaphragm. Mild ill-defined opacity within the adjacent right lung base with an appearance favoring atelectasis. No definite airspace consolidation. No evidence of pleural effusion or pneumothorax. No acute bony abnormality identified. IMPRESSION: Chronic elevation of the right hemidiaphragm. Mild ill-defined opacity within the adjacent right lung base with an appearance favoring atelectasis. No definite airspace consolidation. Electronically Signed   By: Kellie Simmering D.O.   On: 11/02/2021 14:18     Echo preserved left ventricular function on echo  TELEMETRY: Sinus rhythm rate of around 90 nonspecific ST-T wave changes:  ASSESSMENT AND PLAN:  Principal Problem:   Pulmonary emboli  (HCC) Active Problems:   Type 2 diabetes mellitus with hyperlipidemia (HCC)   Hyperlipidemia, unspecified   Hypertension   Severe sepsis ([place])   Morbid obesity with BMI of 50.0-59.9, adult (HCC)   NSTEMI (non-ST elevated myocardial infarction) ([place])   AKI (acute kidney injury) ([place])   Acute HFrEF (heart failure with reduced ejection fraction) ([place])    Plan Acute pulmonary embolus recommend anticoagulation patient has evidence of left heart strain maintain IV heparin therapy Recommend vascular evaluation for intervention with Dr. Lucky Cowboy Supplemental oxygen therapy for hypoxemia continue medical therapy follow-up EKGs and troponins Acute renal insufficiency recommend adequate hydration Obstructive sleep apnea management, sleep study CPAP weight along   Yolonda Kida, MD 11/04/2021 10:16 AM

## 2021-11-04 NOTE — Progress Notes (Signed)
ANTICOAGULATION CONSULT NOTE  Pharmacy Consult for IV Heparin Indication: chest pain/ACS/STEMI and bilateral pulmonary emboli  Patient Measurements: Height: '5\' 5"'$  (165.1 cm) Weight: (!) 154.7 kg (341 lb) IBW/kg (Calculated) : 57 Heparin Dosing Weight: 96.3 kg  Labs: Recent Labs    11/02/21 1531 11/02/21 1531 11/02/21 2104 11/03/21 0454 11/03/21 0828 11/03/21 1227 11/03/21 1432 11/03/21 2113 11/04/21 0333 11/04/21 0441 11/04/21 1107 11/04/21 1912  HGB 11.7*  --   --  11.2*  --   --   --   --  10.8*  --   --   --   HCT 36.8  --   --  35.1*  --   --   --   --  33.7*  --   --   --   PLT 214  --   --  130*  --  133*  --   --  136*  --   --   --   APTT 33  --   --   --   --   --   --   --   --   --   --   --   LABPROT 14.7  --   --   --   --   --   --   --   --   --   --   --   INR 1.2  --   --   --   --   --   --   --   --   --   --   --   HEPARINUNFRC  --    < > 0.58 0.22*  --  0.26*  --    < > 0.26*  --  0.19* 0.26*  CREATININE 1.63*  --  1.60* 1.35*  --   --   --   --  1.37*  --   --   --   TROPONINIHS 1,088*  727*  --   --   --    < > 2,713* 2,905*  --   --  1,698*  --   --    < > = values in this interval not displayed.    Estimated Creatinine Clearance: 64.6 mL/min (A) (by C-G formula based on SCr of 1.37 mg/dL (H)).  Medical History: Past Medical History:  Diagnosis Date   Allergy    Cancer ([place])    basal cell skin ca   Diabetes mellitus without complication ([place])    Hyperlipidemia    Hypertension    Rosacea    Assessment: Pt is a 61 yo F with PMH HTN, T2DM, HLD, obesity, OSA on CPAP, basal cell cancer. Pt presented with 2-day h/o cough and shortness of breath. CT identified multiple segmental and subsegmental bilateral pulmonary emboli, as above. Troponins elevated at presentation and remain elevated at 2,713. ECG pending. Pt endorses increasing nausea over the past week and also notes that she had COVID in Feb 2023 and since then has had DOE at baseline. Bcx  collected, growing GNRs (Enterobacterales, speciation not identified by BCID) in 4/4 bottles. PLT dropped from 214 >> 133 after starting heparin but since then have stabilized. Pt not any anticoagulation PTA.  Goal of Therapy:  Heparin level 0.3-0.7 units/ml Monitor platelets by anticoagulation protocol: Yes   Date Time HL Rate/Comment 8/26 0454 0.22 Subthera 8/26 1227 0.26 Subthera 8/26 2113 0.33 Thera x 1 8/27 0333 0.26 Subthera 8/27 1107 0.19 Subthera 8/27 1912 0.26 Subthera  Plan:  Bolus  2500 units and increase heparin infusion to 2300 units/hr Recheck HL in 6 hrs after rate change Monitor CBC daily while on heparin, especially PLT in this patient given initial downtrend following starting heparin  Wynelle Cleveland  11/04/2021 8:18 PM

## 2021-11-04 NOTE — Assessment & Plan Note (Signed)
Blood pressure currently soft. Holding home lisinopril for softer blood pressure and AKI. -Continue to monitor

## 2021-11-04 NOTE — Progress Notes (Signed)
ANTICOAGULATION CONSULT NOTE  Pharmacy Consult for IV heparin Indication: chest pain/ACS/STEMI and bilateral pulmonary emboli  Allergies  Allergen Reactions   Codeine Sulfate    Simvastatin     Other reaction(s): Muscle Pain   Sulphadimidine [Sulfamethazine]    Patient Measurements: Height: '5\' 5"'$  (165.1 cm) Weight: (!) 154.7 kg (341 lb) IBW/kg (Calculated) : 57 Heparin Dosing Weight: 96.3 kg  Vital Signs: BP: 86/73 (08/27 0300) Pulse Rate: 114 (08/27 0400)  Labs: Recent Labs    11/02/21 1531 11/02/21 1531 11/02/21 2104 11/03/21 0454 11/03/21 0828 11/03/21 1032 11/03/21 1227 11/03/21 1432 11/03/21 2113 11/04/21 0333  HGB 11.7*  --   --  11.2*  --   --   --   --   --  10.8*  HCT 36.8  --   --  35.1*  --   --   --   --   --  33.7*  PLT 214  --   --  130*  --   --  133*  --   --  136*  APTT 33  --   --   --   --   --   --   --   --   --   LABPROT 14.7  --   --   --   --   --   --   --   --   --   INR 1.2  --   --   --   --   --   --   --   --   --   HEPARINUNFRC  --    < > 0.58 0.22*  --   --  0.26*  --  0.33 0.26*  CREATININE 1.63*  --  1.60* 1.35*  --   --   --   --   --  1.37*  TROPONINIHS 1,088*  727*  --   --   --    < > 2,773* 2,713* 2,905*  --   --    < > = values in this interval not displayed.     Estimated Creatinine Clearance: 64.6 mL/min (A) (by C-G formula based on SCr of 1.37 mg/dL (H)).   Medical History: Past Medical History:  Diagnosis Date   Allergy    Cancer ([place])    basal cell skin ca   Diabetes mellitus without complication (HCC)    Hyperlipidemia    Hypertension    Rosacea     Medications:  Medications Prior to Admission  Medication Sig Dispense Refill Last Dose   lisinopril (ZESTRIL) 20 MG tablet Take 1 tablet (20 mg total) by mouth once daily 90 tablet 4 Past Week   metFORMIN (GLUCOPHAGE) 1000 MG tablet Take 1 tablet (1,000 mg total) by mouth 2 (two) times daily 180 tablet 3 Past Week   phentermine (ADIPEX-P) 37.5 MG tablet Take  1 tablet (37.5 mg total) by mouth every morning before breakfast for 120 days 30 tablet 3 Past Week   pioglitazone (ACTOS) 30 MG tablet TAKE 1 TABLET BY MOUTH ONCE DAILY 90 tablet 3 Past Week   rosuvastatin (CRESTOR) 5 MG tablet Take 1 tablet (5 mg total) by mouth once daily 90 tablet 3 Past Week   traZODone (DESYREL) 50 MG tablet Take 1 tablet (50 mg total) by mouth at bedtime 90 tablet 3 Past Week at 2200   Scheduled:   Chlorhexidine Gluconate Cloth  6 each Topical Daily   insulin aspart  0-15 Units Subcutaneous TID WC  insulin aspart  0-5 Units Subcutaneous QHS   rosuvastatin  5 mg Oral Daily   traZODone  50 mg Oral QHS   Infusions:   ceFEPime (MAXIPIME) IV 2 g (11/03/21 2138)   heparin 1,600 Units/hr (11/04/21 0401)   PRN: acetaminophen **OR** acetaminophen, benzonatate, guaiFENesin, guaiFENesin-dextromethorphan, ipratropium, metoprolol tartrate, ondansetron **OR** ondansetron (ZOFRAN) IV Anti-infectives (From admission, onward)    Start     Dose/Rate Route Frequency Ordered Stop   11/03/21 2200  cefTRIAXone (ROCEPHIN) 2 g in sodium chloride 0.9 % 100 mL IVPB  Status:  Discontinued        2 g 200 mL/hr over 30 Minutes Intravenous Every 24 hours 11/03/21 0752 11/03/21 1158   11/03/21 1400  ceFEPIme (MAXIPIME) 2 g in sodium chloride 0.9 % 100 mL IVPB        2 g 200 mL/hr over 30 Minutes Intravenous Every 8 hours 11/03/21 1158     11/02/21 2215  cefTRIAXone (ROCEPHIN) 1 g in sodium chloride 0.9 % 100 mL IVPB  Status:  Discontinued        1 g 200 mL/hr over 30 Minutes Intravenous Every 24 hours 11/02/21 2204 11/03/21 0752       Assessment: Pt is a 61 yo F with PMH HTN, T2DM, HLD, obesity, OSA on CPAP, basal cell cancer. Pt presented with 2-day h/o cough and shortness of breath. CT identified multiple segmental and subsegmental bilateral pulmonary emboli, as above. Troponins elevated at presentation and remain elevated at 2,713. ECG pending. Pt endorses increasing nausea over the  past week and also notes that she had COVID in Feb 2023 and since then has had DOE at baseline. Bcx collected, growing GNRs (Enterobacterales, speciation not identified by BCID) in 4/4 bottles. PLT dropped from 214 >> 133 after starting heparin but since then have stabilized. Pt not any anticoagulation PTA.  Goal of Therapy:  Heparin level 0.3-0.7 units/ml Monitor platelets by anticoagulation protocol: Yes   Date Time HL Rate/Comment 8/26 0454 0.22 Subtherapeutic x 1 8/26 1227 0.26 Subtherapeutic x 2 8/26 2113 0.33 Therapeutic x 1 8/27 0333 0.26 Subtherapeutic  Plan:  Bolus 1400 units x 1 Increase heparin infusion to 1750 units/hr Recheck HL in 6 hrs Monitor CBC daily while on heparin, especially PLT in this patient given initial downtrend following starting heparin  Renda Rolls, PharmD, [facility] 11/04/2021 4:24 AM

## 2021-11-04 NOTE — Progress Notes (Signed)
Progress Note   Patient: Carolyn Levine MAU:[id number] DOB: 1960/06/06 DOA: 11/02/2021     2 DOS: the patient was seen and examined on 11/04/2021   Brief hospital course: Taken from H&P.  Carolyn Levine is a 61 y.o. female with medical history significant of  HTN, DMII,HLD, obesity ,OSA on cpap, qhs basal cell cancer who presents to ED with1- 2 days of sob,and cough. Patient states over the last week she has had increase nausea no emesis. She notes she related this to recently starting Adipex. However yesterday she started to experience sob and cough that was persistent and above her usual baseline sob.  Patient notes since she had COVID 2/23 she has had mild DOE at baseline.  Patient also noted chills and palpitations associated with her symptoms. She however denies chest pain, fever/ presyncope, abdominal pain, leg pain or increase leg swelling from baseline.   ED course.  On arrival patient was tachycardic, tachypneic, borderline blood pressure and mildly hypoxic requiring up to 2 L of oxygen.  Labs pertinent for significant leukocytosis with WBC of 38.2, hemoglobin of 11.7, AKI with BUN of 28 and creatinine of 1.63 with baseline less than 1.  T. bili started trending up at 1.4. Lactic acid of 3.3, procalcitonin at 48.56, troponin 727>>1088, and BNP of 534.  D-dimer of 15.58 UA with protein urea and large leukocytes along with many bacteria.  Urine cultures pending. Chest x-ray with chronic elevation of left hemidiaphragm.  Mild ill-defined opacity within the adjacent right lung base with an appearance of favoring atelectasis and no definite airspace consolidation. CTA chest with multiple segmental and subsegmental bilateral PE, overall clot burden is moderate.  Mildly enlarged RV to LV ratio, 1.08, may be mild right heart strain, not considered specific due to lack of central pulmonary embolism. Patient was started on heparin infusion and vascular surgery was consulted who will see the patient in  the morning. She was started on ceftriaxone for concern of UTI.  8/26: Patient remained quite tachycardic and tachypneic.  1 episode of being febrile at 100 F, remained on 2 L of oxygen. Leukocytosis improving, significant drop in platelet count 130 from 214, some improvement in creatinine to 1.35 with getting some IV fluid.  Preliminary blood cultures negative, urine cultures pending. Clinically undetermined sepsis due to confounding factor of PE. Patient do have leukocytosis, tachycardia, tachypnea, meeting criteria for severe sepsis with AKI and lactic acidosis, she also has a confounding factor of pulmonary embolism which can be presented same way.  If urine culture is positive then she can be declared both as septic and having pulmonary embolism. Worsening troponin, echocardiogram with low EF and wall motion abnormalities, difficult films to get a proper read per cardiology, most likely had NSTEMI. Lactic acidosis resolved Blood cultures with Enterobacter fascia species, no further characterization yet, antibiotics broadened to cefepime to cover Citrobacter freundii-we will de-escalate once more culture data is available. Called interventional radiology at [facility], spoke with Dr. Daryll Brod who reviewed the films and does not think that she need any thrombectomies as most of her clots are in the periphery, no central clotting.  Films were more consistent with pulmonary hypertension. Case also discussed with Dr. Clayborn Bigness from cardiology and they will not be do any catheterization until her bacteremia clears off. We will continue heparin infusion for now. Can Try low-dose beta-blocker with midodrine. Right and left cardiac catheterization most likely sometime next week once bacteremia clears off. Repeat blood cultures on Monday.  8/27: Clinically seems improving and stable.  Urine and blood cultures growing  KLEBSIELLA ORNITHINOLYTICA-pending susceptibility.  Troponin peaked at 04/20/2003 and  now started trending down.  No chest pain. Will repeat blood cultures tomorrow.  Patient is a very complicated lady, high risk for deterioration. Also consulted PCCM.   Assessment and Plan: * Pulmonary emboli [facility]) Patient with bilateral segmental and subsegmental PE, per interventional radiology not very significant clot burden, no thrombectomy indicated.  They do not think that she has any right heart strain based on this clot burden.  Imaging with concern of pulmonary hypertension.  Saturating well on 2 L of oxygen. Vascular surgery and IR was consulted. Vascular surgery coverage over the weekend does not treat pulmonary embolisms. -Continue with heparin infusion -Continue supportive care  Severe sepsis [facility]) Patient met sepsis criteria with fever, heart leukocytosis, tachycardia and tachypnea.  Severe sepsis with evidence of endorgan compromise with AKI and lactic acidosis. Preliminary 4/4 blood  and urine culture growing KLEBSIELLA ORNITHINOLYTICA-pending susceptibility -continue with cefepime Continue with supportive care -Did receive some IV fluid but should be careful based on new echo results of HFrEF.  NSTEMI (non-ST elevated myocardial infarction) (HCC) Troponin peaked at 2905, started trending down.  No chest pain. Echocardiogram with concern of regional wall motion abnormalities. Cardiology is on board but they will not be performing any cardiac catheterization until bacteremia improves. -Continue with IV heparin infusion. -Add low-dose metoprolol -Continue home dose of statin  Acute HFrEF (heart failure with reduced ejection fraction) (HCC) Echocardiogram with EF of 30 to 35% and regional wall motion abnormalities.  Dilated right ventricle.  Per cardiology very difficult window and pictures to comment on. Received IV fluid and will be high risk for further becoming volume overloaded but blood pressure soft and need fluid for sepsis and AKI. -Monitor volume status very  closely -Will need right and left cardiac catheterization once bacteremia improves.   AKI (acute kidney injury) ([place]) Most likely secondary to sepsis. Creatinine seems stable at 1.37 today  baseline less than 1. -Continue with gentle IV hydration -Monitor renal function Avoid nephrotoxins-  Type 2 diabetes mellitus with hyperlipidemia ([place]) Patient was on metformin and Actos at home. -Moderate scale SSI  Hyperlipidemia, unspecified - Continue home Crestor  Hypertension Blood pressure currently soft. Holding home lisinopril for softer blood pressure and AKI. -Continue to monitor  Morbid obesity with BMI of 50.0-59.9, adult (HCC) Estimated body mass index is 56.75 kg/m as calculated from the following:   Height as of this encounter: 5' 5" (1.651 m).   Weight as of this encounter: 154.7 kg.   -This will complicate overall prognosis.   Subjective: Patient was sitting in chair comfortably when seen today.  Denies any chest pain or shortness of breath.  Sister at bedside.  Physical Exam: Vitals:   11/04/21 0600 11/04/21 0635 11/04/21 0700 11/04/21 1100  BP:  (!) 118/54 (!) 131/115 111/61  Pulse: (!) 114 (!) 114 (!) 111 (!) 103  Resp: (!) 23 (!) 24 (!) 22 (!) 21  Temp:   98.3 F (36.8 C)   TempSrc:   Oral   SpO2: 100% 99% 100% 97%  Weight:      Height:       General.  Morbidly obese lady, in no acute distress. Pulmonary.  Lungs clear bilaterally, normal respiratory effort. CV.  Regular rate and rhythm, no JVD, rub or murmur. Abdomen.  Soft, nontender, nondistended, BS positive. CNS.  Alert and oriented .  No focal neurologic deficit.  Extremities.  No edema, no cyanosis, pulses intact and symmetrical. Psychiatry.  Judgment and insight appears normal.  Data Reviewed: Prior data reviewed  Family Communication: Discussed with sister at bedside  Disposition: Status is: Inpatient Remains inpatient appropriate because: Severity of illness   Planned Discharge  Destination: Home  DVT prophylaxis.  Heparin infusion Time spent: 50 minutes  This record has been created using Systems analyst. Errors have been sought and corrected,but may not always be located. Such creation errors do not reflect on the standard of care.  Author: Lorella Nimrod, MD 11/04/2021 1:08 PM  For on call review [email or web address].

## 2021-11-04 NOTE — Assessment & Plan Note (Signed)
Most likely secondary to sepsis. Creatinine seems stable at 1.37 today  baseline less than 1. -Continue with gentle IV hydration -Monitor renal function Avoid nephrotoxins-

## 2021-11-04 NOTE — Progress Notes (Signed)
ANTICOAGULATION CONSULT NOTE  Pharmacy Consult for IV Heparin Indication: chest pain/ACS/STEMI and bilateral pulmonary emboli  Patient Measurements: Height: '5\' 5"'$  (165.1 cm) Weight: (!) 154.7 kg (341 lb) IBW/kg (Calculated) : 57 Heparin Dosing Weight: 96.3 kg  Labs: Recent Labs    11/02/21 1531 11/02/21 1531 11/02/21 2104 11/03/21 0454 11/03/21 0828 11/03/21 1227 11/03/21 1432 11/03/21 2113 11/04/21 0333 11/04/21 0441 11/04/21 1107  HGB 11.7*  --   --  11.2*  --   --   --   --  10.8*  --   --   HCT 36.8  --   --  35.1*  --   --   --   --  33.7*  --   --   PLT 214  --   --  130*  --  133*  --   --  136*  --   --   APTT 33  --   --   --   --   --   --   --   --   --   --   LABPROT 14.7  --   --   --   --   --   --   --   --   --   --   INR 1.2  --   --   --   --   --   --   --   --   --   --   HEPARINUNFRC  --    < > 0.58 0.22*  --  0.26*  --  0.33 0.26*  --  0.19*  CREATININE 1.63*  --  1.60* 1.35*  --   --   --   --  1.37*  --   --   TROPONINIHS 1,088*  727*  --   --   --    < > 2,713* 2,905*  --   --  1,698*  --    < > = values in this interval not displayed.    Estimated Creatinine Clearance: 64.6 mL/min (A) (by C-G formula based on SCr of 1.37 mg/dL (H)).  Medical History: Past Medical History:  Diagnosis Date   Allergy    Cancer ([place])    basal cell skin ca   Diabetes mellitus without complication ([place])    Hyperlipidemia    Hypertension    Rosacea    Assessment: Pt is a 61 yo F with PMH HTN, T2DM, HLD, obesity, OSA on CPAP, basal cell cancer. Pt presented with 2-day h/o cough and shortness of breath. CT identified multiple segmental and subsegmental bilateral pulmonary emboli, as above. Troponins elevated at presentation and remain elevated at 2,713. ECG pending. Pt endorses increasing nausea over the past week and also notes that she had COVID in Feb 2023 and since then has had DOE at baseline. Bcx collected, growing GNRs (Enterobacterales, speciation not  identified by BCID) in 4/4 bottles. PLT dropped from 214 >> 133 after starting heparin but since then have stabilized. Pt not any anticoagulation PTA.  Goal of Therapy:  Heparin level 0.3-0.7 units/ml Monitor platelets by anticoagulation protocol: Yes   Date Time HL Rate/Comment 8/26 0454 0.22 Subthera 8/26 1227 0.26 Subthera 8/26 2113 0.33 Thera x 1 8/27 0333 0.26 Subthera 8/27 1107 0.19 Subthera  Plan:  Bolus 3000 units and increase heparin infusion to 2100 units/hr Recheck HL in 6 hrs after rate change Monitor CBC daily while on heparin, especially PLT in this patient given initial downtrend following starting heparin  Benita Gutter  11/04/2021 12:14 PM

## 2021-11-04 NOTE — Assessment & Plan Note (Signed)
Echocardiogram with EF of 30 to 35% and regional wall motion abnormalities.  Dilated right ventricle.  Per cardiology very difficult window and pictures to comment on. Received IV fluid and will be high risk for further becoming volume overloaded but blood pressure soft and need fluid for sepsis and AKI. -Monitor volume status very closely -Will need right and left cardiac catheterization once bacteremia improves.

## 2021-11-05 DIAGNOSIS — E785 Hyperlipidemia, unspecified: Secondary | ICD-10-CM | POA: Diagnosis not present

## 2021-11-05 DIAGNOSIS — I5021 Acute systolic (congestive) heart failure: Secondary | ICD-10-CM | POA: Diagnosis not present

## 2021-11-05 DIAGNOSIS — N179 Acute kidney failure, unspecified: Secondary | ICD-10-CM | POA: Diagnosis not present

## 2021-11-05 DIAGNOSIS — I2699 Other pulmonary embolism without acute cor pulmonale: Secondary | ICD-10-CM | POA: Diagnosis not present

## 2021-11-05 LAB — GLUCOSE, CAPILLARY
Glucose-Capillary: 153 mg/dL — ABNORMAL HIGH (ref 70–99)
Glucose-Capillary: 119 mg/dL — ABNORMAL HIGH (ref 70–99)
Glucose-Capillary: 107 mg/dL — ABNORMAL HIGH (ref 70–99)
Glucose-Capillary: 135 mg/dL — ABNORMAL HIGH (ref 70–99)
Glucose-Capillary: 133 mg/dL — ABNORMAL HIGH (ref 70–99)

## 2021-11-05 LAB — URINE CULTURE: Culture: >=100000 — AB

## 2021-11-05 LAB — CULTURE, BLOOD (ROUTINE X 2)
Special Requests: ADEQUATE
Special Requests: ADEQUATE

## 2021-11-05 LAB — CBC
HCT: 29.7 % — ABNORMAL LOW (ref 36.0–46.0)
Hemoglobin: 9.5 g/dL — ABNORMAL LOW (ref 12.0–15.0)
MCH: 28.9 pg (ref 26.0–34.0)
MCHC: 32 g/dL (ref 30.0–36.0)
MCV: 90.3 fL (ref 80.0–100.0)
Platelets: 134 10*3/uL — ABNORMAL LOW (ref 150–400)
RBC: 3.29 MIL/uL — ABNORMAL LOW (ref 3.87–5.11)
RDW: 13.4 % (ref 11.5–15.5)
WBC: 13.7 10*3/uL — ABNORMAL HIGH (ref 4.0–10.5)
nRBC: 0 % (ref 0.0–0.2)

## 2021-11-05 LAB — BASIC METABOLIC PANEL
Anion gap: 10 (ref 5–15)
BUN: 42 mg/dL — ABNORMAL HIGH (ref 8–23)
CO2: 23 mmol/L (ref 22–32)
Calcium: 8.4 mg/dL — ABNORMAL LOW (ref 8.9–10.3)
Chloride: 104 mmol/L (ref 98–111)
Creatinine, Ser: 1.09 mg/dL — ABNORMAL HIGH (ref 0.44–1.00)
GFR, Estimated: 57 mL/min — ABNORMAL LOW (ref >60)
Glucose, Bld: 146 mg/dL — ABNORMAL HIGH (ref 70–99)
Potassium: 4 mmol/L (ref 3.5–5.1)
Sodium: 137 mmol/L (ref 135–145)

## 2021-11-05 LAB — HEPARIN LEVEL (UNFRACTIONATED)
Heparin Unfractionated: 0.27 IU/mL — ABNORMAL LOW (ref 0.30–0.70)
Heparin Unfractionated: 0.45 IU/mL (ref 0.30–0.70)

## 2021-11-05 MED ORDER — APIXABAN 5 MG PO TABS: 5.0000 mg | ORAL_TABLET | Freq: Two times a day (BID) | ORAL | Status: DC

## 2021-11-05 MED ORDER — ROSUVASTATIN CALCIUM 5 MG PO TABS
5.0000 mg | ORAL_TABLET | ORAL | Status: DC
  Administered 2021-11-07: 5 mg via ORAL
  Filled 2021-11-05: qty 1

## 2021-11-05 MED ORDER — LEVOFLOXACIN 500 MG PO TABS
750.0000 mg | ORAL_TABLET | Freq: Every day | ORAL | Status: DC
  Administered 2021-11-06 – 2021-11-07 (×2): 750 mg via ORAL
  Filled 2021-11-05 (×2): qty 2

## 2021-11-05 MED ORDER — APIXABAN 5 MG PO TABS
10.0000 mg | ORAL_TABLET | Freq: Two times a day (BID) | ORAL | Status: DC
  Administered 2021-11-05 – 2021-11-07 (×5): 10 mg via ORAL
  Filled 2021-11-05 (×5): qty 2

## 2021-11-05 MED ORDER — ROSUVASTATIN CALCIUM 10 MG PO TABS
10.0000 mg | ORAL_TABLET | Freq: Every day | ORAL | Status: DC
  Filled 2021-11-05: qty 1

## 2021-11-05 MED ORDER — HEPARIN BOLUS VIA INFUSION
2500.0000 [IU] | Freq: Once | INTRAVENOUS | Status: AC
Start: 2021-11-05 — End: 2021-11-05
  Administered 2021-11-05: 2500 [IU] via INTRAVENOUS
  Filled 2021-11-05: qty 2500

## 2021-11-05 MED ORDER — ASPIRIN 81 MG PO CHEW
81.0000 mg | CHEWABLE_TABLET | Freq: Every day | ORAL | Status: DC
  Administered 2021-11-05 – 2021-11-07 (×3): 81 mg via ORAL
  Filled 2021-11-05 (×3): qty 1

## 2021-11-05 MED ORDER — METOPROLOL TARTRATE 25 MG PO TABS
12.5000 mg | ORAL_TABLET | Freq: Two times a day (BID) | ORAL | Status: DC
  Administered 2021-11-05 – 2021-11-06 (×3): 12.5 mg via ORAL
  Filled 2021-11-05 (×3): qty 1

## 2021-11-05 NOTE — Progress Notes (Signed)
Progress Note   Patient: Carolyn Levine JIR:[id number] DOB: 01-22-1961 DOA: 11/02/2021     3 DOS: the patient was seen and examined on 11/05/2021   Brief hospital course: Taken from H&P.  Carolyn Levine is a 61 y.o. female with medical history significant of  HTN, DMII,HLD, obesity ,OSA on cpap, qhs basal cell cancer who presents to ED with1- 2 days of sob,and cough. Patient states over the last week she has had increase nausea no emesis. She notes she related this to recently starting Adipex. However yesterday she started to experience sob and cough that was persistent and above her usual baseline sob.  Patient notes since she had COVID 2/23 she has had mild DOE at baseline.  Patient also noted chills and palpitations associated with her symptoms. She however denies chest pain, fever/ presyncope, abdominal pain, leg pain or increase leg swelling from baseline.   ED course.  On arrival patient was tachycardic, tachypneic, borderline blood pressure and mildly hypoxic requiring up to 2 L of oxygen.  Labs pertinent for significant leukocytosis with WBC of 38.2, hemoglobin of 11.7, AKI with BUN of 28 and creatinine of 1.63 with baseline less than 1.  T. bili started trending up at 1.4. Lactic acid of 3.3, procalcitonin at 48.56, troponin 727>>1088, and BNP of 534.  D-dimer of 15.58 UA with protein urea and large leukocytes along with many bacteria.  Urine cultures pending. Chest x-ray with chronic elevation of left hemidiaphragm.  Mild ill-defined opacity within the adjacent right lung base with an appearance of favoring atelectasis and no definite airspace consolidation. CTA chest with multiple segmental and subsegmental bilateral PE, overall clot burden is moderate.  Mildly enlarged RV to LV ratio, 1.08, may be mild right heart strain, not considered specific due to lack of central pulmonary embolism. Patient was started on heparin infusion and vascular surgery was consulted who will see the patient in  the morning. She was started on ceftriaxone for concern of UTI.  8/26: Patient remained quite tachycardic and tachypneic.  1 episode of being febrile at 100 F, remained on 2 L of oxygen. Leukocytosis improving, significant drop in platelet count 130 from 214, some improvement in creatinine to 1.35 with getting some IV fluid.  Preliminary blood cultures negative, urine cultures pending. Clinically undetermined sepsis due to confounding factor of PE. Patient do have leukocytosis, tachycardia, tachypnea, meeting criteria for severe sepsis with AKI and lactic acidosis, she also has a confounding factor of pulmonary embolism which can be presented same way.  If urine culture is positive then she can be declared both as septic and having pulmonary embolism. Worsening troponin, echocardiogram with low EF and wall motion abnormalities, difficult films to get a proper read per cardiology, most likely had NSTEMI. Lactic acidosis resolved Blood cultures with Enterobacter fascia species, no further characterization yet, antibiotics broadened to cefepime to cover Citrobacter freundii-we will de-escalate once more culture data is available. Called interventional radiology at [facility], spoke with Dr. Daryll Brod who reviewed the films and does not think that she need any thrombectomies as most of her clots are in the periphery, no central clotting.  Films were more consistent with pulmonary hypertension. Case also discussed with Dr. Clayborn Bigness from cardiology and they will not be do any catheterization until her bacteremia clears off. We will continue heparin infusion for now. Can Try low-dose beta-blocker with midodrine. Right and left cardiac catheterization most likely sometime next week once bacteremia clears off. Repeat blood cultures on Monday.  8/27: Clinically seems improving and stable.  Urine and blood cultures growing  KLEBSIELLA ORNITHINOLYTICA-pending susceptibility.  Troponin peaked at 04/20/2003 and  now started trending down.  No chest pain. Will repeat blood cultures tomorrow.  8/28: Patient continued to improve.  Repeat blood cultures negative in 12 hours.  No chest pain or shortness of breath.  Saturating well on room air.  Labs improving. Cardiology is now considering right and left cardiac catheterization as an outpatient after completing the course of antibiotics. Patient is being switched from heparin to Eliquis today. Can be transferred to progressive care.  Patient is a very complicated lady, high risk for deterioration. Also consulted PCCM.   Assessment and Plan: * Pulmonary emboli ([place]) Now saturating well on room air Patient with bilateral segmental and subsegmental PE, per interventional radiology not very significant clot burden, no thrombectomy indicated.  They do not think that she has any right heart strain based on this clot burden.  Imaging with concern of pulmonary hypertension.  -Switch heparin infusion with Eliquis -Continue supportive care  Severe sepsis [facility]) Patient met sepsis criteria with fever, heart leukocytosis, tachycardia and tachypnea.  Severe sepsis with evidence of endorgan compromise with AKI and lactic acidosis. Preliminary 4/4 blood  and urine culture growing KLEBSIELLA ORNITHINOLYTICA-with good sensitivity. -continue with cefepime Continue with supportive care -Did receive some IV fluid but should be careful based on new echo results of HFrEF.  NSTEMI (non-ST elevated myocardial infarction) (HCC) Troponin peaked at 2905, started trending down.  No chest pain. Echocardiogram with concern of regional wall motion abnormalities. Cardiology is on board and might do right and left cardiac catheterization as an outpatient. -Switch heparin with Eliquis -Add low-dose metoprolol -Continue home dose of statin-patient just taking 3 times a day as it was causing myalgias. -Need to discuss with her cardiologist and PCP for cholesterol  management.  Acute HFrEF (heart failure with reduced ejection fraction) (HCC) Echocardiogram with EF of 30 to 35% and regional wall motion abnormalities.  Dilated right ventricle.  Per cardiology very difficult window and pictures to comment on. Appears euvolemic.  Did receive IV fluid secondary to sepsis. -Monitor volume status very closely -Will need right and left cardiac catheterization, most likely as an outpatient per cardiology.   AKI (acute kidney injury) ([place]) Most likely secondary to sepsis. Creatinine continue to improve, at 1.09 today  baseline less than 1. -Encourage p.o. hydration -Monitor renal function Avoid nephrotoxins-  Type 2 diabetes mellitus with hyperlipidemia ([place]) Patient was on metformin and Actos at home. -Moderate scale SSI  Hyperlipidemia, unspecified - Continue home Crestor -Need to discuss with her cardiologist and primary care provider for further management due to inability to take daily dosage  Hypertension Blood pressure within goal She was started on metoprolol and cardiology is recommending stopping lisinopril at this time. -Continue to monitor  Morbid obesity with BMI of 50.0-59.9, adult ([place]) Estimated body mass index is 56.75 kg/m as calculated from the following:   Height as of this encounter: 5' 5" (1.651 m).   Weight as of this encounter: 154.7 kg.   -This will complicate overall prognosis.   Subjective: Patient was sitting in chair when seen today.  Denies any chest pain or shortness of breath.  She was feeling much improved.  She was on room air.  Physical Exam: Vitals:   11/05/21 0700 11/05/21 0800 11/05/21 0900 11/05/21 1315  BP: (!) 111/58   109/88  Pulse: 89 92 98 96  Resp: _0 16  Temp: 98.2 F (36.8 C)     TempSrc: Oral     SpO2: 100% 100% 95% 96%  Weight:      Height:       General.  Morbidly obese lady, in no acute distress. Pulmonary.  Lungs clear bilaterally, normal respiratory effort. CV.  Regular  rate and rhythm, no JVD, rub or murmur. Abdomen.  Soft, nontender, nondistended, BS positive. CNS.  Alert and oriented .  No focal neurologic deficit. Extremities.  No edema, no cyanosis, pulses intact and symmetrical. Psychiatry.  Judgment and insight appears normal.  Data Reviewed:  Prior data reviewed  Family Communication: Sister at bedside  Disposition: Status is: Inpatient Remains inpatient appropriate because: Severity of illness   Planned Discharge Destination: Home  DVT prophylaxis.  Eliquis Time spent: 45 minutes  This record has been created using Systems analyst. Errors have been sought and corrected,but may not always be located. Such creation errors do not reflect on the standard of care.  Author: Lorella Nimrod, MD 11/05/2021 2:06 PM  For on call review [email or web address].

## 2021-11-05 NOTE — Assessment & Plan Note (Signed)
Most likely secondary to sepsis. Creatinine continue to improve, at 1.09 today  baseline less than 1. -Encourage p.o. hydration -Monitor renal function Avoid nephrotoxins-

## 2021-11-05 NOTE — Progress Notes (Addendum)
[facility] Cardiology    SUBJECTIVE: Sitting up in a chair feeling much better no significant worsening shortness of breath which she has had chronically no chest pain leg swelling is somewhat improved appetite is still poor no fever chills or sweats   Vitals:   11/05/21 0600 11/05/21 0700 11/05/21 0800 11/05/21 0900  BP: 118/69 (!) 111/58    Pulse: 99 89 92 98  Resp: (!) '22 19 20 18  '$ Temp:      TempSrc:      SpO2: 98% 100% 100% 95%  Weight:      Height:         Intake/Output Summary (Last 24 hours) at 11/05/2021 1032 Last data filed at 11/05/2021 4696 Gross per 24 hour  Intake 919.07 ml  Output --  Net 919.07 ml      PHYSICAL EXAM  General: Well developed, well nourished, in no acute distress HEENT:  Normocephalic and atramatic Neck:  No JVD.  Lungs: Clear bilaterally to auscultation and percussion. Heart: HRRR . Normal S1 and S2 without gallops or murmurs.  Abdomen: Bowel sounds are positive, abdomen soft and non-tender  Msk:  Back normal, normal gait. Normal strength and tone for age. Extremities: No clubbing, cyanosis or edema.   Neuro: Alert and oriented X 3. Psych:  Good affect, responds appropriately   LABS: Basic Metabolic Panel: Recent Labs    11/03/21 0454 11/04/21 0333  NA 136 136  K 3.9 3.8  CL 107 106  CO2 23 22  GLUCOSE 159* 138*  BUN 32* 42*  CREATININE 1.35* 1.37*  CALCIUM 8.0* 7.9*   Liver Function Tests: Recent Labs    11/03/21 0454 11/04/21 0333  AST 41 30  ALT 27 22  ALKPHOS 63 75  BILITOT 1.4* 1.4*  PROT 7.1 7.2  ALBUMIN 3.2* 2.9*   No results for input(s): "LIPASE", "AMYLASE" in the last 72 hours. CBC: Recent Labs    11/04/21 0333 11/05/21 0426  WBC 22.3* 13.7*  HGB 10.8* 9.5*  HCT 33.7* 29.7*  MCV 89.4 90.3  PLT 136* 134*   Cardiac Enzymes: No results for input(s): "CKTOTAL", "CKMB", "CKMBINDEX", "TROPONINI" in the last 72 hours. BNP: Invalid input(s): "POCBNP" D-Dimer: Recent Labs    11/02/21 1531  DDIMER 15.58*    Hemoglobin A1C: Recent Labs    11/02/21 1320  HGBA1C 6.3*   Fasting Lipid Panel: No results for input(s): "CHOL", "HDL", "LDLCALC", "TRIG", "CHOLHDL", "LDLDIRECT" in the last 72 hours. Thyroid Function Tests: Recent Labs    11/02/21 2104  TSH 1.289   Anemia Panel: No results for input(s): "VITAMINB12", "FOLATE", "FERRITIN", "TIBC", "IRON", "RETICCTPCT" in the last 72 hours.  No results found.   Echo technically difficult study probable moderately depressed left ventricular function around 30 to 35%?  TELEMETRY:  Tele independently review and interpreted by me. Sinus tachycardia rate around 100:  nsstw  ASSESSMENT AND PLAN:  Principal Problem:   Pulmonary emboli (HCC) Active Problems:   Type 2 diabetes mellitus with hyperlipidemia (HCC)   Hyperlipidemia, unspecified   Hypertension   Severe sepsis ([place])   Morbid obesity with BMI of 50.0-59.9, adult (HCC)   NSTEMI (non-ST elevated myocardial infarction) ([place])   AKI (acute kidney injury) ([place])   Acute HFrEF (heart failure with reduced ejection fraction) ([place])    Plan Possible non-STEMI with peak troponin of about 2000 would consider cardiac catheter and patient stable may need to be done as an outpatient when she recovers Anticoagulation is necessary with heparin aspirin beta-blocker if tolerated  based on her hypotension Patient will need anticoagulation for pulmonary embolus probably transition to Eliquis Continue diabetes management and control Agree with broad-spectrum antibiotic therapy for sepsis We will need evaluation for LV function echocardiogram was not helpful with a TEE TEE would consider MUGA or nuclear study Continue ICU level care and management support until patient improves clinically Continue adequate hydration for renal insufficiency  Yolonda Kida, MD 11/05/2021 10:32 AM

## 2021-11-05 NOTE — Assessment & Plan Note (Signed)
Echocardiogram with EF of 30 to 35% and regional wall motion abnormalities.  Dilated right ventricle.  Per cardiology very difficult window and pictures to comment on. Appears euvolemic.  Did receive IV fluid secondary to sepsis. -Monitor volume status very closely -Will need right and left cardiac catheterization, most likely as an outpatient per cardiology.

## 2021-11-05 NOTE — Assessment & Plan Note (Signed)
Patient met sepsis criteria with fever, heart leukocytosis, tachycardia and tachypnea.  Severe sepsis with evidence of endorgan compromise with AKI and lactic acidosis. Preliminary 4/4 blood  and urine culture growing KLEBSIELLA ORNITHINOLYTICA-with good sensitivity. -continue with cefepime Continue with supportive care -Did receive some IV fluid but should be careful based on new echo results of HFrEF.

## 2021-11-05 NOTE — Assessment & Plan Note (Signed)
Troponin peaked at 2905, started trending down.  No chest pain. Echocardiogram with concern of regional wall motion abnormalities. Cardiology is on board and might do right and left cardiac catheterization as an outpatient. -Switch heparin with Eliquis -Add low-dose metoprolol -Continue home dose of statin-patient just taking 3 times a day as it was causing myalgias. -Need to discuss with her cardiologist and PCP for cholesterol management.

## 2021-11-05 NOTE — Assessment & Plan Note (Signed)
-   Continue home Crestor -Need to discuss with her cardiologist and primary care provider for further management due to inability to take daily dosage

## 2021-11-05 NOTE — Progress Notes (Signed)
[place] for transition of IV heparin to apixaban Indication: chest pain/ACS/STEMI and bilateral pulmonary emboli  Patient Measurements: Height: '5\' 5"'$  (165.1 cm) Weight: (!) 154.7 kg (341 lb) IBW/kg (Calculated) : 57  Labs: Recent Labs    11/02/21 1531 11/02/21 2104 11/03/21 0454 11/03/21 0828 11/03/21 1227 11/03/21 1432 11/03/21 2113 11/04/21 0333 11/04/21 0441 11/04/21 1107 11/04/21 1912 11/05/21 0426 11/05/21 1205  HGB 11.7*  --  11.2*  --   --   --   --  10.8*  --   --   --  9.5*  --   HCT 36.8  --  35.1*  --   --   --   --  33.7*  --   --   --  29.7*  --   PLT 214  --  130*  --  133*  --   --  136*  --   --   --  134*  --   APTT 33  --   --   --   --   --   --   --   --   --   --   --   --   LABPROT 14.7  --   --   --   --   --   --   --   --   --   --   --   --   INR 1.2  --   --   --   --   --   --   --   --   --   --   --   --   HEPARINUNFRC  --    < > 0.22*  --  0.26*  --    < > 0.26*  --    < > 0.26* 0.27* 0.45  CREATININE 1.63*   < > 1.35*  --   --   --   --  1.37*  --   --   --   --  1.09*  TROPONINIHS 1,088*  727*  --   --    < > 2,713* 2,905*  --   --  1,698*  --   --   --   --    < > = values in this interval not displayed.    Estimated Creatinine Clearance: 81.2 mL/min (A) (by C-G formula based on SCr of 1.09 mg/dL (H)).  Medical History: Past Medical History:  Diagnosis Date   Allergy    Cancer ([place])    basal cell skin ca   Diabetes mellitus without complication ([place])    Hyperlipidemia    Hypertension    Rosacea    Assessment: Pt is a 61 yo F with PMH HTN, T2DM, HLD, obesity, OSA on CPAP, basal cell cancer. Pt presented with 2-day h/o cough and shortness of breath. CT identified multiple segmental and subsegmental bilateral pulmonary emboli, as above. Troponins elevated at presentation and remain elevated at 2,713. ECG pending. Pt endorses increasing nausea over the past week and also notes that she had COVID  in Feb 2023 and since then has had DOE at baseline. Bcx collected, growing GNRs (Enterobacterales, speciation not identified by BCID) in 4/4 bottles. PLT dropped from 214 >> 133 after starting heparin but since then have stabilized. Pt not any anticoagulation PTA.  Goal of Therapy:  Monitor platelets by anticoagulation protocol: Yes    Plan:  Stop heparin infusion Start apixaban 10 mg orally twice daily for  the first 7 days of therapy; after 7 days, 5 mg orally twice daily  CBC and SCr weekly unless more frequent monitoring is needed     Dallie Piles  11/05/2021 1:58 PM

## 2021-11-05 NOTE — Assessment & Plan Note (Signed)
Now saturating well on room air Patient with bilateral segmental and subsegmental PE, per interventional radiology not very significant clot burden, no thrombectomy indicated.  They do not think that she has any right heart strain based on this clot burden.  Imaging with concern of pulmonary hypertension.  -Switch heparin infusion with Eliquis -Continue supportive care

## 2021-11-05 NOTE — Assessment & Plan Note (Signed)
Blood pressure within goal She was started on metoprolol and cardiology is recommending stopping lisinopril at this time. -Continue to monitor

## 2021-11-05 NOTE — Progress Notes (Signed)
[place] for IV Heparin Indication: chest pain/ACS/STEMI and bilateral pulmonary emboli  Patient Measurements: Height: '5\' 5"'$  (165.1 cm) Weight: (!) 154.7 kg (341 lb) IBW/kg (Calculated) : 57 Heparin Dosing Weight: 96.3 kg  Labs: Recent Labs    11/02/21 1531 11/02/21 1531 11/02/21 2104 11/03/21 0454 11/03/21 0828 11/03/21 1227 11/03/21 1432 11/03/21 2113 11/04/21 0333 11/04/21 0441 11/04/21 1107 11/04/21 1912 11/05/21 0426  HGB 11.7*  --   --  11.2*  --   --   --   --  10.8*  --   --   --  9.5*  HCT 36.8  --   --  35.1*  --   --   --   --  33.7*  --   --   --  29.7*  PLT 214  --   --  130*  --  133*  --   --  136*  --   --   --  134*  APTT 33  --   --   --   --   --   --   --   --   --   --   --   --   LABPROT 14.7  --   --   --   --   --   --   --   --   --   --   --   --   INR 1.2  --   --   --   --   --   --   --   --   --   --   --   --   HEPARINUNFRC  --    < > 0.58 0.22*  --  0.26*  --    < > 0.26*  --  0.19* 0.26* 0.27*  CREATININE 1.63*  --  1.60* 1.35*  --   --   --   --  1.37*  --   --   --   --   TROPONINIHS 1,088*  727*  --   --   --    < > 2,713* 2,905*  --   --  1,698*  --   --   --    < > = values in this interval not displayed.    Estimated Creatinine Clearance: 64.6 mL/min (A) (by C-G formula based on SCr of 1.37 mg/dL (H)).  Medical History: Past Medical History:  Diagnosis Date   Allergy    Cancer ([place])    basal cell skin ca   Diabetes mellitus without complication ([place])    Hyperlipidemia    Hypertension    Rosacea    Assessment: Pt is a 61 yo F with PMH HTN, T2DM, HLD, obesity, OSA on CPAP, basal cell cancer. Pt presented with 2-day h/o cough and shortness of breath. CT identified multiple segmental and subsegmental bilateral pulmonary emboli, as above. Troponins elevated at presentation and remain elevated at 2,713. ECG pending. Pt endorses increasing nausea over the past week and also notes that she had  COVID in Feb 2023 and since then has had DOE at baseline. Bcx collected, growing GNRs (Enterobacterales, speciation not identified by BCID) in 4/4 bottles. PLT dropped from 214 >> 133 after starting heparin but since then have stabilized. Pt not any anticoagulation PTA.  Goal of Therapy:  Heparin level 0.3-0.7 units/ml Monitor platelets by anticoagulation protocol: Yes   Date Time HL Rate/Comment 8/26 0454 0.22 Subthera 8/26 1227 0.26 Subthera 8/26  2113 0.33 Thera x 1 8/27 0333 0.26 Subthera 8/27 1107 0.19 Subthera 8/27 1912 0.26 Subthera 8/28 0426 0.27 Subtherapeutic  Plan:  Bolus 2500 units and increase heparin infusion to 2500 units/hr Recheck HL in 6 hrs after rate change Monitor CBC daily while on heparin, especially PLT in this patient given initial downtrend following starting heparin  Sabas Frett,Natha S  11/05/2021 5:12 AM

## 2021-11-05 NOTE — Progress Notes (Addendum)
Patient was seen evaluated and examined by me and the PA on 11/05/21.  Course of action, evaluation, and management decisions were developed solely by me, but detailed below in the PA's note.  [facility] NOTE       Patient ID: Carolyn Levine MRN: [id number] DOB/AGE: June 20, 1960 61 y.o.  Admit date: 11/02/2021 Referring Physician Dr. Myles Rosenthal Primary Physician Dr. [name] Primary Cardiologist Dr. Nehemiah Massed Reason for Consultation elevated troponin  HPI: Carolyn Levine is a 50yoF with a PMH of hypertension, hyperlipidemia, type 2 diabetes, OSA on CPAP, morbid obesity who presented to [facility] ED 11/02/2021 with a cough and shortness of breath for 1 to 2 days.  She was found to have bilateral segmental and subsegmental PEs, reviewed by IR and vascular surgery and thrombectomy was not indicated.  She met sepsis criteria on admission, preliminary blood cultures growing Enterobacter species, urine culture growing Klebsiella ornitholytica.  Troponin peaked at 2900 on 8/26, cardiology is consulted for further assistance.  Interval history: -Seen sitting upright in recliner, on room air -Says she feels less short of breath today and able to move around a little bit better.  Feeling overall better than when she initially presented to the ED. -Unable to lie flat without significant coughing, she felt uncomfortable lying flat for her CT scan several days ago -No chest pain, heart racing.  Some swelling in her lower legs bilaterally  Review of systems complete and found to be negative unless listed above     Past Medical History:  Diagnosis Date   Allergy    Cancer ([place])    basal cell skin ca   Diabetes mellitus without complication ([place])    Hyperlipidemia    Hypertension    Rosacea     Past Surgical History:  Procedure Laterality Date   CHOLECYSTECTOMY  1994    Medications Prior to Admission  Medication Sig Dispense Refill Last Dose   lisinopril (ZESTRIL)  20 MG tablet Take 1 tablet (20 mg total) by mouth once daily 90 tablet 4 Past Week   metFORMIN (GLUCOPHAGE) 1000 MG tablet Take 1 tablet (1,000 mg total) by mouth 2 (two) times daily 180 tablet 3 Past Week   phentermine (ADIPEX-P) 37.5 MG tablet Take 1 tablet (37.5 mg total) by mouth every morning before breakfast for 120 days 30 tablet 3 Past Week   pioglitazone (ACTOS) 30 MG tablet TAKE 1 TABLET BY MOUTH ONCE DAILY 90 tablet 3 Past Week   rosuvastatin (CRESTOR) 5 MG tablet Take 1 tablet (5 mg total) by mouth once daily 90 tablet 3 Past Week   traZODone (DESYREL) 50 MG tablet Take 1 tablet (50 mg total) by mouth at bedtime 90 tablet 3 Past Week at 2200   Social History   Socioeconomic History   Marital status: Married    Spouse name: Not on file   Number of children: Not on file   Years of education: Not on file   Highest education level: Not on file  Occupational History   Not on file  Tobacco Use   Smoking status: Never   Smokeless tobacco: Never  Substance and Sexual Activity   Alcohol use: No    Alcohol/week: 0.0 standard drinks of alcohol   Drug use: No   Sexual activity: Not on file  Other Topics Concern   Not on file  Social History Narrative   Not on file   Social Determinants of Health   Financial Resource Strain: Not on file  Food Insecurity: Not on file  Transportation Needs: Not on file  Physical Activity: Not on file  Stress: Not on file  Social Connections: Not on file  Intimate Partner Violence: Not on file    Family History  Problem Relation Age of Onset   Allergy (severe) Sister    Cancer Mother    Diabetes Father    Heart failure Father    Diabetes Sister    Breast cancer Neg Hx       PHYSICAL EXAM General: Pleasant Caucasian female, well nourished, in no acute distress.  Sitting upright in recliner with her sister at bedside. HEENT:  Normocephalic and atraumatic. Neck:  No JVD.  Lungs: Normal respiratory effort on room air. Clear bilaterally  to auscultation. No wheezes, crackles, rhonchi.  Heart: Tachycardic but regular. Normal S1 and S2 without gallops or murmurs.  Abdomen: Obese appearing.  Msk: Normal strength and tone for age. Extremities: Warm and well perfused. No clubbing, cyanosis.  Trace bilateral lower extremity edema with slight erythema to shins..  Neuro: Alert and oriented X 3. Psych:  Answers questions appropriately.   Labs: Basic Metabolic Panel: Recent Labs    11/03/21 0454 11/04/21 0333  NA 136 136  K 3.9 3.8  CL 107 106  CO2 23 22  GLUCOSE 159* 138*  BUN 32* 42*  CREATININE 1.35* 1.37*  CALCIUM 8.0* 7.9*   Liver Function Tests: Recent Labs    11/03/21 0454 11/04/21 0333  AST 41 30  ALT 27 22  ALKPHOS 63 75  BILITOT 1.4* 1.4*  PROT 7.1 7.2  ALBUMIN 3.2* 2.9*   No results for input(s): "LIPASE", "AMYLASE" in the last 72 hours. CBC: Recent Labs    11/04/21 0333 11/05/21 0426  WBC 22.3* 13.7*  HGB 10.8* 9.5*  HCT 33.7* 29.7*  MCV 89.4 90.3  PLT 136* 134*   Cardiac Enzymes: Recent Labs    11/03/21 1227 11/03/21 1432 11/04/21 0441  TROPONINIHS 2,713* 2,905* 1,698*   BNP: Invalid input(s): "POCBNP" D-Dimer: Recent Labs    11/02/21 1531  DDIMER 15.58*   Hemoglobin A1C: Recent Labs    11/02/21 1320  HGBA1C 6.3*   Fasting Lipid Panel: No results for input(s): "CHOL", "HDL", "LDLCALC", "TRIG", "CHOLHDL", "LDLDIRECT" in the last 72 hours. Thyroid Function Tests: Recent Labs    11/02/21 2104  TSH 1.289   Anemia Panel: No results for input(s): "VITAMINB12", "FOLATE", "FERRITIN", "TIBC", "IRON", "RETICCTPCT" in the last 72 hours.  No results found.   Radiology: ECHOCARDIOGRAM COMPLETE  Result Date: 11/03/2021    ECHOCARDIOGRAM REPORT   Patient Name:   Carolyn Levine Date of Exam: 11/03/2021 Medical Rec #:  [id number]       Height:       65.0 in Accession #:    [id number]      Weight:       341.0 lb Date of Birth:  [birth year]       BSA:          2.482 m Patient Age:     45 years        BP:           111/56 mmHg Patient Gender: F               HR:           130 bpm. Exam Location:  [facility] Procedure: 2D Echo and Intracardiac Opacification Agent Indications:     Pulmonary Embolus I26.09  History:  Patient has prior history of Echocardiogram examinations, most                  recent 04/29/2021.  Sonographer:     Kathlen Brunswick RDCS Referring Phys:  [id number] SARA-MAIZ A THOMAS Diagnosing Phys: Yolonda Kida MD  Sonographer Comments: Technically challenging study due to limited acoustic windows, no apical window, no subcostal window, patient is obese and no parasternal window. Image acquisition challenging due to patient body habitus. IMPRESSIONS  1. Probably mod reduced LVF globally EF=30-35%?.  2. Very TDS Poor windows.  3. TDS. Left ventricular ejection fraction, by estimation, is 30 to 35%. The left ventricle has moderately decreased function. The left ventricle demonstrates regional wall motion abnormalities (see scoring diagram/findings for description). The left ventricular internal cavity size was moderately dilated. Left ventricular diastolic function could not be evaluated.  4. TDS. Right ventricular systolic function was not well visualized. The right ventricular size is moderately enlarged.  5. TDS.  6. The mitral valve was not well visualized. TDS mitral valve regurgitation.  7. Tricuspid valve regurgitation TDS.  8. The aortic valve was not well visualized. Unable to determine aortic valve morphology due to image quality. Aortic valve regurgitation TDS.  9. Pulmonic valve regurgitation TDS. Conclusion(s)/Recommendation(s): Poor windows for evaluation of left ventricular function by transthoracic echocardiography. Would recommend an alternative means of evaluation. No measurements or calculations were performed on this study. FINDINGS  Left Ventricle: TDS. Left ventricular ejection fraction, by estimation, is 30 to 35%. The left ventricle has moderately decreased  function. The left ventricle demonstrates regional wall motion abnormalities. Definity contrast agent was given IV to delineate the left ventricular endocardial borders. The left ventricular internal cavity size was moderately dilated. There is no left ventricular hypertrophy. Left ventricular diastolic function could not be evaluated. Right Ventricle: TDS. The right ventricular size is moderately enlarged. No increase in right ventricular wall thickness. Right ventricular systolic function was not well visualized. Left Atrium: TDS. Left atrial size was not well visualized. Right Atrium: Right atrial size was not well visualized. Pericardium: The pericardium was not well visualized. Mitral Valve: The mitral valve was not well visualized. TDS mitral valve regurgitation. Tricuspid Valve: The tricuspid valve is not well visualized. Tricuspid valve regurgitation TDS. Aortic Valve: The aortic valve was not well visualized. Aortic valve regurgitation TDS. Pulmonic Valve: The pulmonic valve was not well visualized. Pulmonic valve regurgitation TDS. Aorta: Aortic root could not be assessed. IAS/Shunts: The interatrial septum was not assessed. Additional Comments: Very TDS Poor windows. Probably mod reduced LVF globally EF=30-35%?Yolonda Kida MD Electronically signed by Yolonda Kida MD Signature Date/Time: 11/03/2021/11:32:17 AM    Final    CT Angio Chest PE W/Cm &/Or Wo Cm  Result Date: 11/02/2021 CLINICAL DATA:  Fever, cough, shortness of breath x2 days. Tachycardia. Hypoxia. EXAM: CT ANGIOGRAPHY CHEST WITH CONTRAST TECHNIQUE: Multidetector CT imaging of the chest was performed using the standard protocol during bolus administration of intravenous contrast. Multiplanar CT image reconstructions and MIPs were obtained to evaluate the vascular anatomy. RADIATION DOSE REDUCTION: This exam was performed according to the departmental dose-optimization program which includes automated exposure control, adjustment  of the mA and/or kV according to patient size and/or use of iterative reconstruction technique. CONTRAST:  16m OMNIPAQUE IOHEXOL 350 MG/ML SOLN COMPARISON:  Chest radiograph dated 11/02/2021. FINDINGS: Cardiovascular: Suboptimal contrast opacification in the pulmonary arteries, with preferential opacification of the aorta and left heart, suggesting suboptimal bolus timing. However, despite these limitations, the study  is positive for multiple segmental and subsegmental pulmonary emboli in the left upper lobe (series 7/image 115), right upper lobe (series 7/images 93, 102, 111, 116, and 126), and in the left lower lobe and lingula (series 7/image 162). Overall clot burden is moderate. Enlargement of the main pulmonary artery, suggesting pulmonary arterial hypertension. Mildly enlarged RV to LV ratio, 1.08. However, given the lack central pulmonary embolism (lobar or above), this is not considered specific for right heart strain. No evidence of thoracic aortic aneurysm or dissection. Cardiomegaly.  No pericardial effusion. Three vessel coronary atherosclerosis. Mediastinum/Nodes: No suspicious mediastinal lymphadenopathy. Visualized thyroid is unremarkable. Lungs/Pleura: Eventration right hemidiaphragm with associated right basilar scarring/atelectasis. Volume loss/atelectasis in the right middle lobe, chronic. Mild ground-glass opacity/mosaic attenuation in the lungs bilaterally, chronic. No findings suspicious for pulmonary infarct. No suspicious pulmonary nodules. No pleural effusion or pneumothorax. Upper Abdomen: Visualized upper abdomen is notable for prior cholecystectomy. Musculoskeletal: Degenerative changes of the visualized thoracolumbar spine. Review of the MIP images confirms the above findings. IMPRESSION: Multiple segmental and subsegmental bilateral pulmonary emboli, as above. Overall clot burden is moderate. Critical Value/emergent results were called by telephone at the time of interpretation on  11/02/2021 at 5:42 pm to provider [facility] , who verbally acknowledged these results. Electronically Signed   By: Julian Hy M.D.   On: 11/02/2021 17:47   DG Chest 2 View  Result Date: 11/02/2021 CLINICAL DATA:  Provided history: Cough, shortness of breath. Fever for 2 days. Heart rate elevation. EXAM: CHEST - 2 VIEW COMPARISON:  CT angiogram chest 04/27/2021. Prior chest radiographs 04/27/2021. FINDINGS: Heart size within normal limits. Chronic elevation of the right hemidiaphragm. Mild ill-defined opacity within the adjacent right lung base with an appearance favoring atelectasis. No definite airspace consolidation. No evidence of pleural effusion or pneumothorax. No acute bony abnormality identified. IMPRESSION: Chronic elevation of the right hemidiaphragm. Mild ill-defined opacity within the adjacent right lung base with an appearance favoring atelectasis. No definite airspace consolidation. Electronically Signed   By: Kellie Simmering D.O.   On: 11/02/2021 14:18    ECHO 11/03/2021 very challenging image acquisition due to patient body habitus, but probably moderately reduced LVEF of 30-35%  TELEMETRY reviewed by me (LT) 11/05/2021 : Sinus rhythm rate 97 to sinus tachycardia low 100s  EKG reviewed by me: 8/25 sinus tach 143 RBBB   Data reviewed by me (LT) 11/05/2021: Hospitalist progress note, admission H&P, vascular surgery progress note, CBC, ordered BMP, telemetry  ASSESSMENT AND PLAN:   LULIE HURD is a 76yoF with a PMH of hypertension, hyperlipidemia, type 2 diabetes, OSA on CPAP, morbid obesity who presented to [facility] ED 11/02/2021 with a cough and shortness of breath for 1 to 2 days.  She was found to have bilateral segmental and subsegmental PEs, reviewed by IR and vascular surgery and thrombectomy was not indicated.  She met sepsis criteria on admission, preliminary blood cultures growing Enterobacter species, urine culture growing Klebsiella ornitholytica.  Troponin peaked at 2900 on  8/26, cardiology is consulted for further assistance.  #Sepsis secondary to klebsiella UTI #enterobacter bacteremia  Leukocytosis improving from 38 on 8/25-13.7 on 8/28, urine culture positive for Klebsiella, repeat blood cultures pending on 8/28.  #Acute bilateral pulmonary embolus Remains on heparin drip, hemodynamically stable, on room air without significant shortness of breath.   -Per hospitalist, CTA films reviewed by Dr. Larena Glassman at [facility] who felt imaging was more c/w pulmonary hypertension  #Elevated troponin, probable NSTEMI Troponin peaked at 2900 on 8/26,  remains chest pain-free. -Continue heparin drip for treatment of PE as above, transition to Eliquis likely at discharge. -Start aspirin 81 mg daily -Start metoprolol 12.5 mg twice daily, uptitrate if BP allows -Hold home lisinopril 20 mg, restart as able -Per patient, only tolerates Crestor 5 mg every 3 days due to myalgias (also reports myalgias with simvastatin)  -Continue to consider [facility] for evaluation of pulmonary hypertension/ischemia, per Dr. Clayborn Bigness this may need to be done on an outpatient basis after she is fully recovered from a respiratory and renal standpoint and from her bacteremia  #New diagnosis of HFrEF (LVEF 30 to 35% 10/2021) Very technically difficult echo, but likely reduced EF as above. Difficult to assess volume status, but appears relatively euvolemic. -GDMT with metoprolol, restart lisinopril and start spironolactone as her blood pressure allows and renal function improves.  Defer Jardiance in the setting of UTI and AKI.  #AKI Baseline renal function from 8/4 creatinine 0.7 and GFR >60. Ordered repeat BMP today, on 8/27 creatinine 1.37 GFR 44 (was 1.63 and 35 on admission)  This patient's plan of care was discussed and created with Dr. Clayborn Bigness and he is in agreement.  Signed: Tristan Schroeder , PA-C 11/05/2021, 8:21 AM [facility]

## 2021-11-05 NOTE — Progress Notes (Signed)
[place] for IV Heparin Indication: chest pain/ACS/STEMI and bilateral pulmonary emboli  Patient Measurements: Height: '5\' 5"'$  (165.1 cm) Weight: (!) 154.7 kg (341 lb) IBW/kg (Calculated) : 57 Heparin Dosing Weight: 96.3 kg  Labs: Recent Labs    11/02/21 1531 11/02/21 2104 11/03/21 0454 11/03/21 0828 11/03/21 1227 11/03/21 1432 11/03/21 2113 11/04/21 0333 11/04/21 0441 11/04/21 1107 11/04/21 1912 11/05/21 0426 11/05/21 1205  HGB 11.7*  --  11.2*  --   --   --   --  10.8*  --   --   --  9.5*  --   HCT 36.8  --  35.1*  --   --   --   --  33.7*  --   --   --  29.7*  --   PLT 214  --  130*  --  133*  --   --  136*  --   --   --  134*  --   APTT 33  --   --   --   --   --   --   --   --   --   --   --   --   LABPROT 14.7  --   --   --   --   --   --   --   --   --   --   --   --   INR 1.2  --   --   --   --   --   --   --   --   --   --   --   --   HEPARINUNFRC  --    < > 0.22*  --  0.26*  --    < > 0.26*  --    < > 0.26* 0.27* 0.45  CREATININE 1.63*   < > 1.35*  --   --   --   --  1.37*  --   --   --   --  1.09*  TROPONINIHS 1,088*  727*  --   --    < > 2,713* 2,905*  --   --  1,698*  --   --   --   --    < > = values in this interval not displayed.    Estimated Creatinine Clearance: 81.2 mL/min (A) (by C-G formula based on SCr of 1.09 mg/dL (H)).  Medical History: Past Medical History:  Diagnosis Date   Allergy    Cancer ([place])    basal cell skin ca   Diabetes mellitus without complication ([place])    Hyperlipidemia    Hypertension    Rosacea    Assessment: Pt is a 62 yo F with PMH HTN, T2DM, HLD, obesity, OSA on CPAP, basal cell cancer. Pt presented with 2-day h/o cough and shortness of breath. CT identified multiple segmental and subsegmental bilateral pulmonary emboli, as above. Troponins elevated at presentation and remain elevated at 2,713. ECG pending. Pt endorses increasing nausea over the past week and also notes that she had  COVID in Feb 2023 and since then has had DOE at baseline. Bcx collected, growing GNRs (Enterobacterales, speciation not identified by BCID) in 4/4 bottles. PLT dropped from 214 >> 133 after starting heparin but since then have stabilized. Pt not any anticoagulation PTA.  Goal of Therapy: Heparin Dosing Weight: 96.3 kg Heparin level 0.3-0.7 units/ml Monitor platelets by anticoagulation protocol: Yes   Date Time HL Rate/Comment 8/26  0454 0.22 Subthera 8/26 1227 0.26 Subthera 8/26 2113 0.33 Thera x1 8/27 0333 0.26 Subthera 8/27 1107 0.19 Subthera 1750>2100 un/hr 8/27 1912 0.26 Subthera 2100>2300 un/hr 8/28 0426 0.27 Subtherapeutic2300 > 2500 un/hr 8/28 1205 0.45 Thera x1; 2500 un/hr  PLTs: 767>011>003 (stable/low)  Plan:  Heparin level therapeutic x1. 0.45 '@1205'$  on 8/28. Continue heparin infusion at 2500 units/hr Recheck confirmatory HL in 6 hrs; then daily with AM labs once consecutively therapeutic. Monitor CBC daily while on heparin, especially PLT in this patient given initial downtrend following starting heparin  Lorna Dibble  11/05/2021 1:47 PM

## 2021-11-05 NOTE — Consult Note (Addendum)
   [facility] CM Inpatient Consult   11/05/2021  Carolyn Levine May 22, 1960 [id number]   [place] Organization [ACO] Patient: [name] Liaison coverage for [name], remote coverage review  Primary Care Provider:  [name], MD, [facility], [place] for progress and post hospital follow up needs from support of the [name]. MD progress notes reviewed.  Referral can be made for [facility] plan for disease management and community resource support.         Plan: Continue to follow for [name] needs for transition.   For additional questions or referrals please contact:   [name], RN BSN [place] [facility] Liaison  [phone] business mobile phone Toll free office [phone]  Fax number: [phone] [name]

## 2021-11-06 ENCOUNTER — Telehealth (HOSPITAL_COMMUNITY): Payer: Self-pay | Admitting: Pharmacy Technician

## 2021-11-06 ENCOUNTER — Other Ambulatory Visit (HOSPITAL_COMMUNITY): Payer: Self-pay

## 2021-11-06 DIAGNOSIS — I5021 Acute systolic (congestive) heart failure: Secondary | ICD-10-CM | POA: Diagnosis not present

## 2021-11-06 DIAGNOSIS — E785 Hyperlipidemia, unspecified: Secondary | ICD-10-CM | POA: Diagnosis not present

## 2021-11-06 DIAGNOSIS — I2699 Other pulmonary embolism without acute cor pulmonale: Secondary | ICD-10-CM | POA: Diagnosis not present

## 2021-11-06 DIAGNOSIS — N179 Acute kidney failure, unspecified: Secondary | ICD-10-CM | POA: Diagnosis not present

## 2021-11-06 LAB — CBC
HCT: 31.8 % — ABNORMAL LOW (ref 36.0–46.0)
Hemoglobin: 10.1 g/dL — ABNORMAL LOW (ref 12.0–15.0)
MCH: 28.5 pg (ref 26.0–34.0)
MCHC: 31.8 g/dL (ref 30.0–36.0)
MCV: 89.6 fL (ref 80.0–100.0)
Platelets: 158 10*3/uL (ref 150–400)
RBC: 3.55 MIL/uL — ABNORMAL LOW (ref 3.87–5.11)
RDW: 13.2 % (ref 11.5–15.5)
WBC: 10.9 10*3/uL — ABNORMAL HIGH (ref 4.0–10.5)
nRBC: 0.2 % (ref 0.0–0.2)

## 2021-11-06 LAB — GLUCOSE, CAPILLARY
Glucose-Capillary: 133 mg/dL — ABNORMAL HIGH (ref 70–99)
Glucose-Capillary: 174 mg/dL — ABNORMAL HIGH (ref 70–99)
Glucose-Capillary: 116 mg/dL — ABNORMAL HIGH (ref 70–99)

## 2021-11-06 LAB — BRAIN NATRIURETIC PEPTIDE: B Natriuretic Peptide: 71 pg/mL (ref 0.0–100.0)

## 2021-11-06 MED ORDER — POLYETHYLENE GLYCOL 3350 17 G PO PACK
17.0000 g | PACK | Freq: Every day | ORAL | Status: DC
  Administered 2021-11-06: 17 g via ORAL
  Filled 2021-11-06 (×2): qty 1

## 2021-11-06 MED ORDER — METOPROLOL TARTRATE 25 MG PO TABS
25.0000 mg | ORAL_TABLET | Freq: Two times a day (BID) | ORAL | Status: DC
  Administered 2021-11-06 – 2021-11-07 (×2): 25 mg via ORAL
  Filled 2021-11-06 (×2): qty 1

## 2021-11-06 MED ORDER — FUROSEMIDE 10 MG/ML IJ SOLN
20.0000 mg | Freq: Two times a day (BID) | INTRAMUSCULAR | Status: AC
Start: 2021-11-06 — End: 2021-11-06
  Administered 2021-11-06 (×2): 20 mg via INTRAVENOUS
  Filled 2021-11-06 (×2): qty 2

## 2021-11-06 MED ORDER — FUROSEMIDE 20 MG PO TABS
20.0000 mg | ORAL_TABLET | Freq: Every day | ORAL | Status: DC
Start: 2021-11-07 — End: 2021-11-07
  Administered 2021-11-07: 20 mg via ORAL
  Filled 2021-11-06: qty 1

## 2021-11-06 MED ORDER — LISINOPRIL 5 MG PO TABS
2.5000 mg | ORAL_TABLET | Freq: Every day | ORAL | Status: DC
  Administered 2021-11-07: 2.5 mg via ORAL
  Filled 2021-11-06: qty 1

## 2021-11-06 NOTE — Progress Notes (Addendum)
Patient was seen evaluated and examined by me and the PA on 11/06/21.  Course of action, evaluation, and management decisions were developed solely by me, but detailed below in the PA's note.  [facility] CARDIOLOG[facility] CONSULT NOTE       Patient ID: Carolyn Levine MRN: [id number] DOB/AGE: 63/05/1960 60 y.o.  Admit date: 11/02/2021 Referring Physician Dr. Sara-Maiz Thomas Primary Physician Dr. James Hedrick Primary Cardiologist Dr. Kowalski Reason for Consultation elevated troponin  HPI: Carolyn Levine is a 60yoF with a PMH of hypertension, hyperlipidemia, type 2 diabetes, OSA on CPAP, morbid obesity who presented to [facility] ED 11/02/2021 with a cough and shortness of breath for 1 to 2 days.  She was found to have bilateral segmental and subsegmental PEs, reviewed by IR and vascular surgery and thrombectomy was not indicated.  She met sepsis criteria on admission, preliminary blood cultures growing Enterobacter species, urine culture growing Klebsiella ornitholytica.  Troponin peaked at 2900 on 8/26, cardiology is consulted for further assistance.  Interval history: -transferred from stepdown to the PCU yesterday -overall feels better from a breathing standpoint. No chest pain.  -reports orthopnea "drowning" when laying down and a productive cough also while laying down -started on eliquis yesterday   Review of systems complete and found to be negative unless listed above     Past Medical History:  Diagnosis Date   Allergy    Cancer (HCC)    basal cell skin ca   Diabetes mellitus without complication (HCC)    Hyperlipidemia    Hypertension    Rosacea     Past Surgical History:  Procedure Laterality Date   CHOLECYSTECTOMY  1994    Medications Prior to Admission  Medication Sig Dispense Refill Last Dose   lisinopril (ZESTRIL) 20 MG tablet Take 1 tablet (20 mg total) by mouth once daily 90 tablet 4 Past Week   metFORMIN (GLUCOPHAGE) 1000 MG tablet Take 1 tablet (1,000 mg total) by  mouth 2 (two) times daily 180 tablet 3 Past Week   phentermine (ADIPEX-P) 37.5 MG tablet Take 1 tablet (37.5 mg total) by mouth every morning before breakfast for 120 days 30 tablet 3 Past Week   pioglitazone (ACTOS) 30 MG tablet TAKE 1 TABLET BY MOUTH ONCE DAILY 90 tablet 3 Past Week   rosuvastatin (CRESTOR) 5 MG tablet Take 1 tablet (5 mg total) by mouth once daily 90 tablet 3 Past Week   traZODone (DESYREL) 50 MG tablet Take 1 tablet (50 mg total) by mouth at bedtime 90 tablet 3 Past Week at 2200   Social History   Socioeconomic History   Marital status: Married    Spouse name: Not on file   Number of children: Not on file   Years of education: Not on file   Highest education level: Not on file  Occupational History   Not on file  Tobacco Use   Smoking status: Never   Smokeless tobacco: Never  Substance and Sexual Activity   Alcohol use: No    Alcohol/week: 0.0 standard drinks of alcohol   Drug use: No   Sexual activity: Not on file  Other Topics Concern   Not on file  Social History Narrative   Not on file   Social Determinants of Health   Financial Resource Strain: Not on file  Food Insecurity: Not on file  Transportation Needs: Not on file  Physical Activity: Not on file  Stress: Not on file  Social Connections: Not on file  Intimate Partner Violence:   Not on file    Family History  Problem Relation Age of Onset   Allergy (severe) Sister    Cancer Mother    Diabetes Father    Heart failure Father    Diabetes Sister    Breast cancer Neg Hx       PHYSICAL EXAM General: Pleasant Caucasian female, well nourished, in no acute distress.  Sitting upright in recliner with her sister at bedside. HEENT:  Normocephalic and atraumatic. Neck:  No JVD.  Lungs: Normal respiratory effort on room air. Clear bilaterally to auscultation. No wheezes, crackles, rhonchi.  Heart: Tachycardic but regular. Normal S1 and S2 without gallops or murmurs.  Abdomen: Obese appearing.   Msk: Normal strength and tone for age. Extremities: Warm and well perfused. No clubbing, cyanosis.  Trace bilateral pedal edema with slight erythema to R shin Neuro: Alert and oriented X 3. Psych:  Answers questions appropriately.   Labs: Basic Metabolic Panel: Recent Labs    11/04/21 0333 11/05/21 1205  NA 136 137  K 3.8 4.0  CL 106 104  CO2 22 23  GLUCOSE 138* 146*  BUN 42* 42*  CREATININE 1.37* 1.09*  CALCIUM 7.9* 8.4*    Liver Function Tests: Recent Labs    11/04/21 0333  AST 30  ALT 22  ALKPHOS 75  BILITOT 1.4*  PROT 7.2  ALBUMIN 2.9*    No results for input(s): "LIPASE", "AMYLASE" in the last 72 hours. CBC: Recent Labs    11/05/21 0426 11/06/21 0438  WBC 13.7* 10.9*  HGB 9.5* 10.1*  HCT 29.7* 31.8*  MCV 90.3 89.6  PLT 134* 158    Cardiac Enzymes: Recent Labs    11/03/21 1227 11/03/21 1432 11/04/21 0441  TROPONINIHS 2,713* 2,905* 1,698*    BNP: Invalid input(s): "POCBNP" D-Dimer: No results for input(s): "DDIMER" in the last 72 hours.  Hemoglobin A1C: No results for input(s): "HGBA1C" in the last 72 hours.  Fasting Lipid Panel: No results for input(s): "CHOL", "HDL", "LDLCALC", "TRIG", "CHOLHDL", "LDLDIRECT" in the last 72 hours. Thyroid Function Tests: No results for input(s): "TSH", "T4TOTAL", "T3FREE", "THYROIDAB" in the last 72 hours.  Invalid input(s): "FREET3"  Anemia Panel: No results for input(s): "VITAMINB12", "FOLATE", "FERRITIN", "TIBC", "IRON", "RETICCTPCT" in the last 72 hours.  No results found.   Radiology: ECHOCARDIOGRAM COMPLETE  Result Date: 11/03/2021    ECHOCARDIOGRAM REPORT   Patient Name:   Carolyn Levine Date of Exam: 11/03/2021 Medical Rec #:  [id number]       Height:       65.0 in Accession #:    [id number]      Weight:       341.0 lb Date of Birth:  05/04/1960       BSA:          2.482 m Patient Age:    60 years        BP:           111/56 mmHg Patient Gender: F               HR:           130 bpm. Exam  Location:  [facility] Procedure: 2D Echo and Intracardiac Opacification Agent Indications:     Pulmonary Embolus I26.09  History:         Patient has prior history of Echocardiogram examinations, most                  recent 04/29/2021.  Sonographer:       Tikeshia Johnson RDCS Referring Phys:  [id number] SARA-MAIZ A THOMAS Diagnosing Phys: Dwayne D Callwood MD  Sonographer Comments: Technically challenging study due to limited acoustic windows, no apical window, no subcostal window, patient is obese and no parasternal window. Image acquisition challenging due to patient body habitus. IMPRESSIONS  1. Probably mod reduced LVF globally EF=30-35%?.  2. Very TDS Poor windows.  3. TDS. Left ventricular ejection fraction, by estimation, is 30 to 35%. The left ventricle has moderately decreased function. The left ventricle demonstrates regional wall motion abnormalities (see scoring diagram/findings for description). The left ventricular internal cavity size was moderately dilated. Left ventricular diastolic function could not be evaluated.  4. TDS. Right ventricular systolic function was not well visualized. The right ventricular size is moderately enlarged.  5. TDS.  6. The mitral valve was not well visualized. TDS mitral valve regurgitation.  7. Tricuspid valve regurgitation TDS.  8. The aortic valve was not well visualized. Unable to determine aortic valve morphology due to image quality. Aortic valve regurgitation TDS.  9. Pulmonic valve regurgitation TDS. Conclusion(s)/Recommendation(s): Poor windows for evaluation of left ventricular function by transthoracic echocardiography. Would recommend an alternative means of evaluation. No measurements or calculations were performed on this study. FINDINGS  Left Ventricle: TDS. Left ventricular ejection fraction, by estimation, is 30 to 35%. The left ventricle has moderately decreased function. The left ventricle demonstrates regional wall motion abnormalities. Definity contrast agent  was given IV to delineate the left ventricular endocardial borders. The left ventricular internal cavity size was moderately dilated. There is no left ventricular hypertrophy. Left ventricular diastolic function could not be evaluated. Right Ventricle: TDS. The right ventricular size is moderately enlarged. No increase in right ventricular wall thickness. Right ventricular systolic function was not well visualized. Left Atrium: TDS. Left atrial size was not well visualized. Right Atrium: Right atrial size was not well visualized. Pericardium: The pericardium was not well visualized. Mitral Valve: The mitral valve was not well visualized. TDS mitral valve regurgitation. Tricuspid Valve: The tricuspid valve is not well visualized. Tricuspid valve regurgitation TDS. Aortic Valve: The aortic valve was not well visualized. Aortic valve regurgitation TDS. Pulmonic Valve: The pulmonic valve was not well visualized. Pulmonic valve regurgitation TDS. Aorta: Aortic root could not be assessed. IAS/Shunts: The interatrial septum was not assessed. Additional Comments: Very TDS Poor windows. Probably mod reduced LVF globally EF=30-35%?. Dwayne D Callwood MD Electronically signed by Dwayne D Callwood MD Signature Date/Time: 11/03/2021/11:32:17 AM    Final    CT Angio Chest PE W/Cm &/Or Wo Cm  Result Date: 11/02/2021 CLINICAL DATA:  Fever, cough, shortness of breath x2 days. Tachycardia. Hypoxia. EXAM: CT ANGIOGRAPHY CHEST WITH CONTRAST TECHNIQUE: Multidetector CT imaging of the chest was performed using the standard protocol during bolus administration of intravenous contrast. Multiplanar CT image reconstructions and MIPs were obtained to evaluate the vascular anatomy. RADIATION DOSE REDUCTION: This exam was performed according to the departmental dose-optimization program which includes automated exposure control, adjustment of the mA and/or kV according to patient size and/or use of iterative reconstruction technique.  CONTRAST:  60mL OMNIPAQUE IOHEXOL 350 MG/ML SOLN COMPARISON:  Chest radiograph dated 11/02/2021. FINDINGS: Cardiovascular: Suboptimal contrast opacification in the pulmonary arteries, with preferential opacification of the aorta and left heart, suggesting suboptimal bolus timing. However, despite these limitations, the study is positive for multiple segmental and subsegmental pulmonary emboli in the left upper lobe (series 7/image 115), right upper lobe (series 7/images 93, 102, 111, 116, and 126), and in the left lower   lobe and lingula (series 7/image 162). Overall clot burden is moderate. Enlargement of the main pulmonary artery, suggesting pulmonary arterial hypertension. Mildly enlarged RV to LV ratio, 1.08. However, given the lack central pulmonary embolism (lobar or above), this is not considered specific for right heart strain. No evidence of thoracic aortic aneurysm or dissection. Cardiomegaly.  No pericardial effusion. Three vessel coronary atherosclerosis. Mediastinum/Nodes: No suspicious mediastinal lymphadenopathy. Visualized thyroid is unremarkable. Lungs/Pleura: Eventration right hemidiaphragm with associated right basilar scarring/atelectasis. Volume loss/atelectasis in the right middle lobe, chronic. Mild ground-glass opacity/mosaic attenuation in the lungs bilaterally, chronic. No findings suspicious for pulmonary infarct. No suspicious pulmonary nodules. No pleural effusion or pneumothorax. Upper Abdomen: Visualized upper abdomen is notable for prior cholecystectomy. Musculoskeletal: Degenerative changes of the visualized thoracolumbar spine. Review of the MIP images confirms the above findings. IMPRESSION: Multiple segmental and subsegmental bilateral pulmonary emboli, as above. Overall clot burden is moderate. Critical Value/emergent results were called by telephone at the time of interpretation on 11/02/2021 at 5:42 pm to provider EVAN BRADLER , who verbally acknowledged these results.  Electronically Signed   By: Sriyesh  Krishnan M.D.   On: 11/02/2021 17:47   DG Chest 2 View  Result Date: 11/02/2021 CLINICAL DATA:  Provided history: Cough, shortness of breath. Fever for 2 days. Heart rate elevation. EXAM: CHEST - 2 VIEW COMPARISON:  CT angiogram chest 04/27/2021. Prior chest radiographs 04/27/2021. FINDINGS: Heart size within normal limits. Chronic elevation of the right hemidiaphragm. Mild ill-defined opacity within the adjacent right lung base with an appearance favoring atelectasis. No definite airspace consolidation. No evidence of pleural effusion or pneumothorax. No acute bony abnormality identified. IMPRESSION: Chronic elevation of the right hemidiaphragm. Mild ill-defined opacity within the adjacent right lung base with an appearance favoring atelectasis. No definite airspace consolidation. Electronically Signed   By: Kyle  Golden D.O.   On: 11/02/2021 14:18    ECHO 11/03/2021 very challenging image acquisition due to patient body habitus, but probably moderately reduced LVEF of 30-35%  TELEMETRY reviewed by me (LT) 11/06/2021 : Sinus rhythm to sinus tachycardia rate 99 to the low 100s with short paroxysms into the 110s  EKG reviewed by me: 8/25 sinus tach 143 RBBB   Data reviewed by me (LT) 11/06/2021: Hospitalist progress note, CBC, BMP, ordered BNP, vitals, telemetry, prior echo from 04/2021  ASSESSMENT AND PLAN:   Carolyn Levine is a 60yoF with a PMH of hypertension, hyperlipidemia, type 2 diabetes, OSA on CPAP, morbid obesity who presented to [facility] ED 11/02/2021 with a cough and shortness of breath for 1 to 2 days.  She was found to have bilateral segmental and subsegmental PEs, reviewed by IR and vascular surgery and thrombectomy was not indicated.  She met sepsis criteria on admission, preliminary blood cultures growing Enterobacter species, urine culture growing Klebsiella ornitholytica.  Troponin peaked at 2900 on 8/26, cardiology is consulted for further  assistance.  #Sepsis secondary to klebsiella UTI #enterobacter bacteremia  Leukocytosis improving from 38 on 8/25-13.7 on 8/28, urine culture positive for Klebsiella, repeat blood cultures on 8/28 without growth after 1 day.  #Acute bilateral pulmonary embolus S/p heparin drip, hemodynamically stable, on room air without significant shortness of breath.   -Per hospitalist, CTA films reviewed by Dr. Trevor Schick at [facility] who felt imaging was more c/w pulmonary hypertension -Started on Eliquis 8/28  #Elevated troponin, probable NSTEMI Troponin peaked at 2900 on 8/26, remains chest pain-free. -Continue heparin drip for treatment of PE as above, transition to Eliquis likely at discharge. -  Continue aspirin 81 mg daily -Increase metoprolol to 25 mg twice daily -Per patient, only tolerates Crestor 5 mg every 3 days due to myalgias (also reports myalgias with simvastatin)  -We will plan for [facility] for evaluation of pulmonary hypertension/ischemia on an outpatient basis, after follow-up with her regular cardiologist Dr. Nehemiah Massed in 1 week.  #New diagnosis of HFrEF (LVEF 30 to 35% 10/2021) Very technically difficult echo, but likely reduced EF as above. Difficult to assess volume status, but appears somewhat volume up in her lower extremities with trace pitting pedal edema, and having orthopnea overnight. Check BNP. -GDMT with metoprolol, restart lisinopril at low-dose 5 mg tomorrow.   -Will give IV Lasix 20 mg x 2 doses and monitor response, if discharge is planned for later today, please send home with 20 mg of p.o. Lasix daily x5 days. -Consider spironolactone as her blood pressure allows and renal function improves.  Defer Jardiance in the setting of UTI and AKI.  #AKI Baseline renal function from 8/4 creatinine 0.7 and GFR >60. Continues to improve after hydration and holding ACEi.  On 8/28 1.09 and 47.  8/27 creatinine 1.37 GFR 44 (was 1.63 and 35 on admission)  This patient's plan of  care was discussed and created with Dr. Clayborn Bigness and he is in agreement.  Signed: Tristan Schroeder , PA-C 11/06/2021, 10:24 AM [facility]

## 2021-11-06 NOTE — Assessment & Plan Note (Addendum)
Echocardiogram with EF of 30 to 35% and regional wall motion abnormalities.  Dilated right ventricle.  Per cardiology very difficult window and pictures to comment on. Developed some orthopnea and lower extremity edema overnight.  BNP at 71  Did receive IV fluid secondary to sepsis. -Cardiology started her on IV Lasix -Will need right and left cardiac catheterization, most likely as an outpatient per cardiology.

## 2021-11-06 NOTE — TOC Benefit Eligibility Note (Signed)
Patient Teacher, English as a foreign language completed.    The patient is currently admitted and upon discharge could be taking Eliquis 5 mg.  The current 30 day co-pay is $104.36.   The patient is insured through [place], [place] Patient Advocate Specialist [place] Patient Advocate Team Direct Number: [phone]  Fax: [phone]

## 2021-11-06 NOTE — Assessment & Plan Note (Signed)
Most likely secondary to sepsis. Creatinine continue to improve, at 1.09 today  baseline less than 1. -Encourage p.o. hydration -Monitor renal function as she is being given IV Lasix now Avoid nephrotoxins-

## 2021-11-06 NOTE — Evaluation (Signed)
Physical Therapy Evaluation Patient Details Name: ASTOU LADA MRN: [id number] DOB: 11-Jan-1961 Today's Date: 11/06/2021  History of Present Illness  Pt admitted for pulm emboli with complaints of SOB and cough. History includes HTN, DM, HLD, and obesity. Also with elevated troponin and NSTEMI.  Clinical Impression  Pt is a pleasant 61 year old female who was admitted for pulm emboli. Pt with imminent dc orders with pt anxious about leaving hospital as she doesn't feel ready. Pt performs transfers and ambulation with cga and RW. Initially ambulated without AD, however reaching for furniture and appears more stable with AD. Recommend short distance ambulation with RW with monitoring vitals. Pt demonstrates deficits with endurance/balance. Resting stats: 94% RA; 93bpm; post sats: 86% RA; HR 117. Cues for pursed lip breathing with quick improvement to 93% RA. Concerns relayed to care team. Would benefit from skilled PT to address above deficits and promote optimal return to PLOF. Currently recommending OP PT to further address deficits. Pt agreeable.     Recommendations for follow up therapy are one component of a multi-disciplinary discharge planning process, led by the attending physician.  Recommendations may be updated based on patient status, additional functional criteria and insurance authorization.  Follow Up Recommendations Outpatient PT      Assistance Recommended at Discharge PRN  Patient can return home with the following  A little help with walking and/or transfers    Equipment Recommendations Rolling walker (2 wheels) (bariatric)  Recommendations for Other Services       Functional Status Assessment Patient has had a recent decline in their functional status and demonstrates the ability to make significant improvements in function in a reasonable and predictable amount of time.     Precautions / Restrictions Precautions Precautions: Fall Restrictions Weight Bearing  Restrictions: No      Mobility  Bed Mobility               General bed mobility comments: NT, received in recliner    Transfers Overall transfer level: Needs assistance Equipment used: None Transfers: Sit to/from Stand Sit to Stand: Min guard           General transfer comment: safe technique with upright posture once standing.    Ambulation/Gait Ambulation/Gait assistance: Min guard Gait Distance (Feet): 70 Feet Assistive device: None, Rolling walker (2 wheels) Gait Pattern/deviations: Step-through pattern       General Gait Details: ambulated initally 30' without AD with unsteadiness and pt reaching out for furniture. Pt given RW and pt with improved balance and gait speed. HR increased to 117bpm with exertion and O2 sats at 86%. Further distance deferred due to fatigue  Stairs            Wheelchair Mobility    Modified Rankin (Stroke Patients Only)       Balance Overall balance assessment: Needs assistance Sitting-balance support: Feet supported Sitting balance-Leahy Scale: Good     Standing balance support: No upper extremity supported Standing balance-Leahy Scale: Fair   Single Leg Stance - Right Leg: 0 Single Leg Stance - Left Leg: 0                         Pertinent Vitals/Pain Pain Assessment Pain Assessment: No/denies pain    Home Living Family/patient expects to be discharged to:: Private residence Living Arrangements: Spouse/significant other Available Help at Discharge: Family;Available PRN/intermittently (husband works during day but can stay home for a few days post discharge.) Type of Home: House  Home Access: Stairs to enter Entrance Stairs-Rails: Can reach both Entrance Stairs-Number of Steps: 5   Home Layout: One level Home Equipment: None      Prior Function Prior Level of Function : Independent/Modified Independent;Working/employed;Driving             Mobility Comments: reports independence with all  mobility, no falls ADLs Comments: working here at [employer] in [place] [place]. driving.     Hand Dominance        Extremity/Trunk Assessment   Upper Extremity Assessment Upper Extremity Assessment: Overall WFL for tasks assessed    Lower Extremity Assessment Lower Extremity Assessment: Overall WFL for tasks assessed       Communication   Communication: No difficulties  Cognition Arousal/Alertness: Awake/alert Behavior During Therapy: WFL for tasks assessed/performed Overall Cognitive Status: Within Functional Limits for tasks assessed                                          General Comments      Exercises     Assessment/Plan    PT Assessment Patient needs continued PT services  PT Problem List Decreased activity tolerance;Decreased balance;Decreased mobility;Cardiopulmonary status limiting activity       PT Treatment Interventions Gait training;DME instruction;Balance training    PT Goals (Current goals can be found in the Care Plan section)  Acute Rehab PT Goals Patient Stated Goal: to get back to normal PT Goal Formulation: With patient Time For Goal Achievement: 11/20/21 Potential to Achieve Goals: Good    Frequency Min 2X/week     Co-evaluation               AM-PAC PT "6 Clicks" Mobility  Outcome Measure Help needed turning from your back to your side while in a flat bed without using bedrails?: None Help needed moving from lying on your back to sitting on the side of a flat bed without using bedrails?: None Help needed moving to and from a bed to a chair (including a wheelchair)?: None Help needed standing up from a chair using your arms (e.g., wheelchair or bedside chair)?: None Help needed to walk in hospital room?: A Little Help needed climbing 3-5 steps with a railing? : A Little 6 Click Score: 22    End of Session   Activity Tolerance: Patient tolerated treatment well Patient left: in chair Nurse Communication: Mobility  status PT Visit Diagnosis: Difficulty in walking, not elsewhere classified (R26.2)    Time: 1101-1120 PT Time Calculation (min) (ACUTE ONLY): 19 min   Charges:   PT Evaluation $PT Eval Low Complexity: 1 Low PT Treatments $Gait Training: 8-22 mins        Greggory Stallion, PT, DPT, GCS [phone]   Jovonne Wilton 11/06/2021, 1:45 PM

## 2021-11-06 NOTE — TOC CM/SW Note (Signed)
[facility] REFERRAL        Occupational Therapy * Physical Therapy * Speech Therapy                           DATE _8/29/2023__       PATIENT NAME_[name]_              PATIENT QIO_[id number]__  DIAGNOSIS/DIAGNOSIS CODE __I26.99, A41.9, E66.01, I21.4, I50.21_  DATE OF DISCHARGE: _8/30/2023_       PRIMARY CARE PHYSICIAN [name], MD_             PCP PHONE/FAX__P: [phone]      Dear Provider (Name: [facility]_  Fax: _[phone]_):   I certify that I have examined this patient and that occupational/physical/speech therapy is necessary on an outpatient basis.    The patient has expressed interest in completing their recommended course of therapy at your  location.  Once a formal order from the patient's primary care physician has been obtained, please  contact him/her to schedule an appointment for evaluation at your earliest convenience.   [ x ]  Physical Therapy Evaluate and Treat                      [  ]  Occupational Therapy Evaluate and Treat                                                             [  ]  Speech Therapy Evaluate and Treat         The patient's primary care physician (listed above) must furnish and be responsible for a formal order such that the recommended services may be furnished while under the primary physician's care, and that the plan of care will be established and reviewed every 30 days (or more often if condition necessitates).   _________________________________________________                             ____________                                Hospitalist/Attending Physician Signature  NOTE:  Diagnosis and signature are required to validate order                 Date                                                  ____________________________________________________________________ Printed Hospitalist/Attending Physician Name

## 2021-11-06 NOTE — Assessment & Plan Note (Signed)
Patient met sepsis criteria with fever, heart leukocytosis, tachycardia and tachypnea.  Severe sepsis with evidence of endorgan compromise with AKI and lactic acidosis. Preliminary 4/4 blood  and urine culture growing KLEBSIELLA ORNITHINOLYTICA-with good sensitivity.  Antibiotics switched to to Levaquin for 5 more days -Continue with Levaquin Continue with supportive care

## 2021-11-06 NOTE — Telephone Encounter (Signed)
Pharmacy Patient Advocate Encounter  Insurance verification completed.    The patient is insured through [employer]   The patient is currently admitted and ran test claims for the following: Eliquis.  Copays and coinsurance results were relayed to Inpatient clinical team.

## 2021-11-06 NOTE — TOC Initial Note (Signed)
Transition of Care [facility]) - Initial/Assessment Note    Patient Details  Name: Carolyn Levine MRN: [id number] Date of Birth: 03/07/1961  [facility]) CM/SW Contact:    Candie Chroman, LCSW Phone Number: 11/06/2021, 3:18 PM  Clinical Narrative:  CSW met with patient. No supports at bedside. CSW introduced role and explained that PT recommendations would be discussed. She is agreeable to outpatient PT and prefers [facility] because she works here. Asked MD to [facility] order. Will fax once complete. Also asked her to order bariatric RW. Will order through [employer] once complete. No further concerns. CSW encouraged patient to contact CSW as needed. CSW will continue to follow patient for support and facilitate return home once stable. Husband will transport her at discharge.                Expected Discharge Plan: OP Rehab Barriers to Discharge: Continued Medical Work up   Patient Goals and CMS Choice     Choice offered to / list presented to : Patient  Expected Discharge Plan and Services Expected Discharge Plan: OP Rehab     Post Acute Care Choice: Durable Medical Equipment (Outpatient PT) Living arrangements for the past 2 months: Single Family Home                                      Prior Living Arrangements/Services Living arrangements for the past 2 months: Single Family Home Lives with:: Spouse Patient language and need for interpreter reviewed:: Yes Do you feel safe going back to the place where you live?: Yes      Need for Family Participation in Patient Care: Yes (Comment) Care giver support system in place?: Yes (comment)   Criminal Activity/Legal Involvement Pertinent to Current Situation/Hospitalization: No - Comment as needed  Activities of Daily Living Home Assistive Devices/Equipment: None ADL Screening (condition at time of admission) Patient's cognitive ability adequate to safely complete daily activities?: Yes Is the patient deaf or  have difficulty hearing?: No Does the patient have difficulty seeing, even when wearing glasses/contacts?: No Does the patient have difficulty concentrating, remembering, or making decisions?: No Patient able to express need for assistance with ADLs?: Yes Does the patient have difficulty dressing or bathing?: No Independently performs ADLs?: Yes (appropriate for developmental age) Does the patient have difficulty walking or climbing stairs?: No Weakness of Legs: None Weakness of Arms/Hands: None  Permission Sought/Granted Permission sought to share information with : Facility Art therapist granted to share information with : Yes, Verbal Permission Granted     Permission granted to share info w AGENCY: [facility]        Emotional Assessment Appearance:: Appears stated age Attitude/Demeanor/Rapport: Engaged, Gracious Affect (typically observed): Accepting, Appropriate, Calm, Pleasant Orientation: : Oriented to Self, Oriented to Place, Oriented to  Time, Oriented to Situation Alcohol / Substance Use: Not Applicable Psych Involvement: No (comment)  Admission diagnosis:  Pulmonary emboli (HCC) [I26.99] Patient Active Problem List   Diagnosis Date Noted   NSTEMI (non-ST elevated myocardial infarction) ([place]) 11/03/2021   AKI (acute kidney injury) ([place]) 11/03/2021   Acute HFrEF (heart failure with reduced ejection fraction) ([place]) 11/03/2021   Pulmonary emboli (HCC) 11/02/2021   Edema 04/29/2021   Constipation 04/29/2021   Morbid obesity with BMI of 50.0-59.9, adult ([place] [facility]) 04/28/2021   Pulmonary nodules 04/28/2021   Acute respiratory failure due to COVID-19 [facility])  04/27/2021   Severe sepsis ([place]) 04/27/2021   Hyperlipidemia, unspecified 06/04/2016   Hypertension 06/04/2016   Anemia 06/04/2016   Type 2 diabetes mellitus with hyperlipidemia ([place]) 03/08/2015   PCP:  [name], MD Pharmacy:   [facility] [place] [address] Phone: [phone] Fax: [phone]  [facility], [place] [address] Phone: [phone] Fax: [phone]     Social Determinants of Health (SDOH) Interventions    Readmission Risk Interventions     No data to display

## 2021-11-06 NOTE — Progress Notes (Signed)
Progress Note   Patient: Carolyn Levine LEC:[id number] DOB: 1960-09-19 DOA: 11/02/2021     4 DOS: the patient was seen and examined on 11/06/2021   Brief hospital course: Taken from H&P.  OBERA STAUCH is a 61 y.o. female with medical history significant of  HTN, DMII,HLD, obesity ,OSA on cpap, qhs basal cell cancer who presents to ED with1- 2 days of sob,and cough. Patient states over the last week she has had increase nausea no emesis. She notes she related this to recently starting Adipex. However yesterday she started to experience sob and cough that was persistent and above her usual baseline sob.  Patient notes since she had COVID 2/23 she has had mild DOE at baseline.  Patient also noted chills and palpitations associated with her symptoms. She however denies chest pain, fever/ presyncope, abdominal pain, leg pain or increase leg swelling from baseline.   ED course.  On arrival patient was tachycardic, tachypneic, borderline blood pressure and mildly hypoxic requiring up to 2 L of oxygen.  Labs pertinent for significant leukocytosis with WBC of 38.2, hemoglobin of 11.7, AKI with BUN of 28 and creatinine of 1.63 with baseline less than 1.  T. bili started trending up at 1.4. Lactic acid of 3.3, procalcitonin at 48.56, troponin 727>>1088, and BNP of 534.  D-dimer of 15.58 UA with protein urea and large leukocytes along with many bacteria.  Urine cultures pending. Chest x-ray with chronic elevation of left hemidiaphragm.  Mild ill-defined opacity within the adjacent right lung base with an appearance of favoring atelectasis and no definite airspace consolidation. CTA chest with multiple segmental and subsegmental bilateral PE, overall clot burden is moderate.  Mildly enlarged RV to LV ratio, 1.08, may be mild right heart strain, not considered specific due to lack of central pulmonary embolism. Patient was started on heparin infusion and vascular surgery was consulted who will see the patient in  the morning. She was started on ceftriaxone for concern of UTI.  8/26: Patient remained quite tachycardic and tachypneic.  1 episode of being febrile at 100 F, remained on 2 L of oxygen. Leukocytosis improving, significant drop in platelet count 130 from 214, some improvement in creatinine to 1.35 with getting some IV fluid.  Preliminary blood cultures negative, urine cultures pending. Clinically undetermined sepsis due to confounding factor of PE. Patient do have leukocytosis, tachycardia, tachypnea, meeting criteria for severe sepsis with AKI and lactic acidosis, she also has a confounding factor of pulmonary embolism which can be presented same way.  If urine culture is positive then she can be declared both as septic and having pulmonary embolism. Worsening troponin, echocardiogram with low EF and wall motion abnormalities, difficult films to get a proper read per cardiology, most likely had NSTEMI. Lactic acidosis resolved Blood cultures with Enterobacter fascia species, no further characterization yet, antibiotics broadened to cefepime to cover Citrobacter freundii-we will de-escalate once more culture data is available. Called interventional radiology at [facility], spoke with Dr. Ruel Favors who reviewed the films and does not think that she need any thrombectomies as most of her clots are in the periphery, no central clotting.  Films were more consistent with pulmonary hypertension. Case also discussed with Dr. Juliann Pares from cardiology and they will not be do any catheterization until her bacteremia clears off. We will continue heparin infusion for now. Can Try low-dose beta-blocker with midodrine. Right and left cardiac catheterization most likely sometime next week once bacteremia clears off. Repeat blood cultures on Monday.  8/27: Clinically seems improving and stable.  Urine and blood cultures growing  KLEBSIELLA ORNITHINOLYTICA-pending susceptibility.  Troponin peaked at 04/20/2003 and  now started trending down.  No chest pain. Will repeat blood cultures tomorrow.  8/28: Patient continued to improve.  Repeat blood cultures negative in 12 hours.  No chest pain or shortness of breath.  Saturating well on room air.  Labs improving. Cardiology is now considering right and left cardiac catheterization as an outpatient after completing the course of antibiotics. Patient is being switched from heparin to Eliquis today. Can be transferred to progressive care.  8/29: Patient developed some orthopnea overnight.  She was started on IV diuresis by cardiology.  Also started on Lovenox for 5 more days.  Patient is a very complicated lady, high risk for deterioration. Also consulted PCCM.   Assessment and Plan: * Pulmonary emboli ([place]) Now saturating well on room air Patient with bilateral segmental and subsegmental PE, per interventional radiology not very significant clot burden, no thrombectomy indicated.  They do not think that she has any right heart strain based on this clot burden.  Imaging with concern of pulmonary hypertension.  She was switched to Eliquis -Continue with Eliquis -Continue supportive care  Severe sepsis [facility]) Patient met sepsis criteria with fever, heart leukocytosis, tachycardia and tachypnea.  Severe sepsis with evidence of endorgan compromise with AKI and lactic acidosis. Preliminary 4/4 blood  and urine culture growing KLEBSIELLA ORNITHINOLYTICA-with good sensitivity.  Antibiotics switched to to Levaquin for 5 more days -Continue with Levaquin Continue with supportive care   NSTEMI (non-ST elevated myocardial infarction) ([place]) Troponin peaked at 2905, started trending down.  No chest pain. Echocardiogram with concern of regional wall motion abnormalities. Cardiology is on board and might do right and left cardiac catheterization as an outpatient. -Switch heparin with Eliquis -Add low-dose metoprolol -Continue home dose of statin-patient just taking 3  times a day as it was causing myalgias. -Need to discuss with her cardiologist and PCP for cholesterol management.  Acute HFrEF (heart failure with reduced ejection fraction) (HCC) Echocardiogram with EF of 30 to 35% and regional wall motion abnormalities.  Dilated right ventricle.  Per cardiology very difficult window and pictures to comment on. Developed some orthopnea and lower extremity edema overnight.  BNP at 71  Did receive IV fluid secondary to sepsis. -Cardiology started her on IV Lasix -Will need right and left cardiac catheterization, most likely as an outpatient per cardiology.   AKI (acute kidney injury) ([place]) Most likely secondary to sepsis. Creatinine continue to improve, at 1.09 today  baseline less than 1. -Encourage p.o. hydration -Monitor renal function as she is being given IV Lasix now Avoid nephrotoxins-  Type 2 diabetes mellitus with hyperlipidemia ([place]) Patient was on metformin and Actos at home. -Moderate scale SSI  Hyperlipidemia, unspecified - Continue home Crestor -Need to discuss with her cardiologist and primary care provider for further management due to inability to take daily dosage  Hypertension Blood pressure within goal She was started on metoprolol and cardiology is recommending stopping lisinopril at this time. -Continue to monitor  Morbid obesity with BMI of 50.0-59.9, adult ([place]) Estimated body mass index is 56.75 kg/m as calculated from the following:   Height as of this encounter: $RemoveBeforeD'5\' 5"'[id number]$  (1.651 m).   Weight as of this encounter: 154.7 kg.   -This will complicate overall prognosis.   Subjective: Patient was seen and examined today.  She developed some shortness of breath while lying down overnight.  Sister at bedside  Physical Exam: Vitals:   11/06/21 0459 11/06/21 0500 11/06/21 0816 11/06/21 1217  BP: (!) 130/93  132/70 129/65  Pulse: (!) 106  98 95  Resp: $Remo'18  17 18  'mHyde$ Temp: 98.8 F (37.1 C)  98.3 F (36.8 C) 98.1 F (36.7 C)   TempSrc: Oral  Oral Oral  SpO2: 94%  97% 97%  Weight:  (!) 158 kg    Height:       General.  Morbidly obese lady, in no acute distress. Pulmonary.  Lungs clear bilaterally, normal respiratory effort. CV.  Regular rate and rhythm, no JVD, rub or murmur. Abdomen.  Soft, nontender, nondistended, BS positive. CNS.  Alert and oriented .  No focal neurologic deficit. Extremities.  1+ LE edema, no cyanosis, pulses intact and symmetrical. Psychiatry.  Judgment and insight appears normal.  Data Reviewed: Prior data reviewed  Family Communication: Discussed with patient and sister at bedside  Disposition: Status is: Inpatient Remains inpatient appropriate because: Need 1 day of IV diuretic diuresis   Planned Discharge Destination: Home and Home with Home Health  DVT prophylaxis.  Eliquis Time spent: 45 minutes  This record has been created using Systems analyst. Errors have been sought and corrected,but may not always be located. Such creation errors do not reflect on the standard of care.  Author: Lorella Nimrod, MD 11/06/2021 2:59 PM  For on call review [email or web address].

## 2021-11-06 NOTE — Assessment & Plan Note (Signed)
Now saturating well on room air Patient with bilateral segmental and subsegmental PE, per interventional radiology not very significant clot burden, no thrombectomy indicated.  They do not think that she has any right heart strain based on this clot burden.  Imaging with concern of pulmonary hypertension.  She was switched to Eliquis -Continue with Eliquis -Continue supportive care

## 2021-11-06 NOTE — TOC CM/SW Note (Signed)
  Transition of Care [facility]) Screening Note   Patient Details  Name: NAYALI TALERICO Date of Birth: Feb 09, 1961   Transition of [facility]) CM/SW Contact:    Candie Chroman, LCSW Phone Number: 11/06/2021, 9:57 AM    Transition of Care Department [facility]) has reviewed patient and no TOC needs have been identified at this time. We will continue to monitor patient advancement through interdisciplinary progression rounds. If new patient transition needs arise, please place a TOC consult.

## 2021-11-07 ENCOUNTER — Encounter: Payer: Self-pay | Admitting: Internal Medicine

## 2021-11-07 ENCOUNTER — Other Ambulatory Visit: Payer: Self-pay

## 2021-11-07 DIAGNOSIS — I2699 Other pulmonary embolism without acute cor pulmonale: Secondary | ICD-10-CM | POA: Diagnosis not present

## 2021-11-07 DIAGNOSIS — R7881 Bacteremia: Secondary | ICD-10-CM

## 2021-11-07 DIAGNOSIS — N390 Urinary tract infection, site not specified: Secondary | ICD-10-CM

## 2021-11-07 DIAGNOSIS — E785 Hyperlipidemia, unspecified: Secondary | ICD-10-CM | POA: Diagnosis not present

## 2021-11-07 DIAGNOSIS — I5021 Acute systolic (congestive) heart failure: Secondary | ICD-10-CM | POA: Diagnosis not present

## 2021-11-07 DIAGNOSIS — E1169 Type 2 diabetes mellitus with other specified complication: Secondary | ICD-10-CM

## 2021-11-07 DIAGNOSIS — N179 Acute kidney failure, unspecified: Secondary | ICD-10-CM | POA: Diagnosis not present

## 2021-11-07 DIAGNOSIS — B9689 Other specified bacterial agents as the cause of diseases classified elsewhere: Secondary | ICD-10-CM

## 2021-11-07 LAB — GLUCOSE, CAPILLARY
Glucose-Capillary: 172 mg/dL — ABNORMAL HIGH (ref 70–99)
Glucose-Capillary: 144 mg/dL — ABNORMAL HIGH (ref 70–99)

## 2021-11-07 LAB — CBC
HCT: 31.7 % — ABNORMAL LOW (ref 36.0–46.0)
Hemoglobin: 10.1 g/dL — ABNORMAL LOW (ref 12.0–15.0)
MCH: 28.5 pg (ref 26.0–34.0)
MCHC: 31.9 g/dL (ref 30.0–36.0)
MCV: 89.3 fL (ref 80.0–100.0)
Platelets: 211 10*3/uL (ref 150–400)
RBC: 3.55 MIL/uL — ABNORMAL LOW (ref 3.87–5.11)
RDW: 13 % (ref 11.5–15.5)
WBC: 13 10*3/uL — ABNORMAL HIGH (ref 4.0–10.5)
nRBC: 0 % (ref 0.0–0.2)

## 2021-11-07 MED ORDER — APIXABAN (ELIQUIS) VTE STARTER PACK (10MG AND 5MG)
ORAL_TABLET | ORAL | 0 refills | Status: AC
  Filled 2021-11-07: qty 74, 28d supply, fill #0

## 2021-11-07 MED ORDER — LISINOPRIL 2.5 MG PO TABS
2.5000 mg | ORAL_TABLET | Freq: Every day | ORAL | 0 refills | Status: DC
Start: 2021-11-08 — End: 2021-12-04
  Filled 2021-11-07: qty 30, 30d supply, fill #0

## 2021-11-07 MED ORDER — ASPIRIN 81 MG PO CHEW
81.0000 mg | CHEWABLE_TABLET | Freq: Every day | ORAL | 1 refills | Status: DC
Start: 2021-11-07 — End: 2023-05-29
  Filled 2021-11-07: qty 90, 90d supply, fill #0

## 2021-11-07 MED ORDER — GUAIFENESIN-DM 100-10 MG/5ML PO SYRP
5.0000 mL | ORAL_SOLUTION | ORAL | 0 refills | Status: AC | PRN
  Filled 2021-11-07: qty 118, 4d supply, fill #0

## 2021-11-07 MED ORDER — METOPROLOL TARTRATE 25 MG PO TABS
25.0000 mg | ORAL_TABLET | Freq: Two times a day (BID) | ORAL | 0 refills | Status: DC
Start: 2021-11-07 — End: 2021-12-04
  Filled 2021-11-07: qty 60, 30d supply, fill #0

## 2021-11-07 MED ORDER — FUROSEMIDE 20 MG PO TABS
20.0000 mg | ORAL_TABLET | Freq: Every day | ORAL | 0 refills | Status: AC
  Filled 2021-11-07: qty 7, 7d supply, fill #0

## 2021-11-07 MED ORDER — LEVOFLOXACIN 750 MG PO TABS
750.0000 mg | ORAL_TABLET | Freq: Every day | ORAL | 0 refills | Status: AC
  Filled 2021-11-07: qty 2, 2d supply, fill #0

## 2021-11-07 MED ORDER — BENZONATATE 200 MG PO CAPS
200.0000 mg | ORAL_CAPSULE | Freq: Three times a day (TID) | ORAL | 0 refills | Status: AC | PRN
  Filled 2021-11-07: qty 20, 7d supply, fill #0

## 2021-11-07 NOTE — TOC Progression Note (Signed)
Transition of Care [facility]) - Progression Note    Patient Details  Name: DAMESHA LAWLER MRN: [id number] Date of Birth: 04-18-1960  Transition of Care [facility]) CM/SW Contact  Candie Chroman, LCSW Phone Number: 11/07/2021, 8:53 AM  Clinical Narrative:  Ordered walker through [employer].   Expected Discharge Plan: OP Rehab Barriers to Discharge: Continued Medical Work up  Expected Discharge Plan and Services Expected Discharge Plan: OP Rehab     Post Acute Care Choice: Durable Medical Equipment (Outpatient PT) Living arrangements for the past 2 months: Single Family Home                                       Social Determinants of Health (SDOH) Interventions    Readmission Risk Interventions     No data to display

## 2021-11-07 NOTE — Progress Notes (Signed)
Mobility Specialist - Progress Note   11/07/21 1132  Mobility  Activity Ambulated independently in room  Level of Assistance Independent  Assistive Device Front wheel walker  Distance Ambulated (ft) 100 ft  Activity Response Tolerated well  $Mobility charge 1 Mobility   Pt sitting in recliner on RA upon arrival. Pt STS and ambulates within room indep. Pt returns to recliner with needs in reach and visitor in room.   Pre-mobility: 95 HR 89 SpO2 During mobility: 94 HR 105 SpO2 Post-mobility: 96 HR 89 SPO2  [facility]  Mobility Specialist  11/07/21 11:35 AM

## 2021-11-07 NOTE — Progress Notes (Addendum)
Patient was se//en evaluated and examined by me and the PA on 11/07/21.  Course of action, evaluation, and management decisions were developed solely by me, but detailed below in the PA's note.  [place] NOTE       Patient ID: Carolyn Levine MRN: [id number] DOB/AGE: [birth year] 61 y.o.  Admit date: 11/02/2021 Referring Physician Dr. Myles Rosenthal Primary Physician Dr. [name] Primary Cardiologist Dr. Nehemiah Massed Reason for Consultation elevated troponin  HPI: [name] is a 61yoF with a PMH of hypertension, hyperlipidemia, type 2 diabetes, OSA on CPAP, morbid obesity who presented to [facility] ED 11/02/2021 with a cough and shortness of breath for 1 to 2 days.  She was found to have bilateral segmental and subsegmental PEs, reviewed by IR and vascular surgery and thrombectomy was not indicated.  She met sepsis criteria on admission, preliminary blood cultures growing Enterobacter species, urine culture growing Klebsiella ornitholytica.  Troponin peaked at 2900 on 8/26, cardiology is consulted for further assistance.  Interval history: -Feels much better today after 2 doses of IV Lasix on 8/29, denies further orthopnea, PND overnight -No shortness of breath, chest pain, palpitations  Review of systems complete and found to be negative unless listed above     Past Medical History:  Diagnosis Date   Allergy    Cancer ([place])    basal cell skin ca   Diabetes mellitus without complication ([place])    Hyperlipidemia    Hypertension    Rosacea     Past Surgical History:  Procedure Laterality Date   CHOLECYSTECTOMY  1994    Medications Prior to Admission  Medication Sig Dispense Refill Last Dose   lisinopril (ZESTRIL) 20 MG tablet Take 1 tablet (20 mg total) by mouth once daily 90 tablet 4 Past Week   metFORMIN (GLUCOPHAGE) 1000 MG tablet Take 1 tablet (1,000 mg total) by mouth 2 (two) times daily 180 tablet 3 Past Week   phentermine (ADIPEX-P) 37.5 MG tablet  Take 1 tablet (37.5 mg total) by mouth every morning before breakfast for 120 days 30 tablet 3 Past Week   pioglitazone (ACTOS) 30 MG tablet TAKE 1 TABLET BY MOUTH ONCE DAILY 90 tablet 3 Past Week   rosuvastatin (CRESTOR) 5 MG tablet Take 1 tablet (5 mg total) by mouth once daily 90 tablet 3 Past Week   traZODone (DESYREL) 50 MG tablet Take 1 tablet (50 mg total) by mouth at bedtime 90 tablet 3 Past Week at 2200   Social History   Socioeconomic History   Marital status: Married    Spouse name: Not on file   Number of children: Not on file   Years of education: Not on file   Highest education level: Not on file  Occupational History   Not on file  Tobacco Use   Smoking status: Never   Smokeless tobacco: Never  Substance and Sexual Activity   Alcohol use: No    Alcohol/week: 0.0 standard drinks of alcohol   Drug use: No   Sexual activity: Not on file  Other Topics Concern   Not on file  Social History Narrative   Not on file   Social Determinants of Health   Financial Resource Strain: Not on file  Food Insecurity: Not on file  Transportation Needs: Not on file  Physical Activity: Not on file  Stress: Not on file  Social Connections: Not on file  Intimate Partner Violence: Not on file    Family History  Problem Relation Age of  Onset   Allergy (severe) Sister    Cancer Mother    Diabetes Father    Heart failure Father    Diabetes Sister    Breast cancer Neg Hx       PHYSICAL EXAM General: Pleasant Caucasian female, well nourished, in no acute distress.  Sitting upright in recliner with her sister at bedside. HEENT:  Normocephalic and atraumatic. Neck:  No JVD.  Lungs: Normal respiratory effort on room air. Clear bilaterally to auscultation. No wheezes, crackles, rhonchi.  Heart: Regular rate and rhythm. Normal S1 and S2 without gallops or murmurs.  Abdomen: Obese appearing.  Msk: Normal strength and tone for age. Extremities: Warm and well perfused. No clubbing,  cyanosis.  Trace bilateral pedal edema with slight erythema to R shin Neuro: Alert and oriented X 3. Psych:  Answers questions appropriately.   Labs: Basic Metabolic Panel: Recent Labs    11/05/21 1205  NA 137  K 4.0  CL 104  CO2 23  GLUCOSE 146*  BUN 42*  CREATININE 1.09*  CALCIUM 8.4*    Liver Function Tests: No results for input(s): "AST", "ALT", "ALKPHOS", "BILITOT", "PROT", "ALBUMIN" in the last 72 hours.  No results for input(s): "LIPASE", "AMYLASE" in the last 72 hours. CBC: Recent Labs    11/06/21 0438 11/07/21 0447  WBC 10.9* 13.0*  HGB 10.1* 10.1*  HCT 31.8* 31.7*  MCV 89.6 89.3  PLT 158 211    Cardiac Enzymes: No results for input(s): "CKTOTAL", "CKMB", "CKMBINDEX", "TROPONINIHS" in the last 72 hours.  BNP: Invalid input(s): "POCBNP" D-Dimer: No results for input(s): "DDIMER" in the last 72 hours.  Hemoglobin A1C: No results for input(s): "HGBA1C" in the last 72 hours.  Fasting Lipid Panel: No results for input(s): "CHOL", "HDL", "LDLCALC", "TRIG", "CHOLHDL", "LDLDIRECT" in the last 72 hours. Thyroid Function Tests: No results for input(s): "TSH", "T4TOTAL", "T3FREE", "THYROIDAB" in the last 72 hours.  Invalid input(s): "FREET3"  Anemia Panel: No results for input(s): "VITAMINB12", "FOLATE", "FERRITIN", "TIBC", "IRON", "RETICCTPCT" in the last 72 hours.  No results found.   Radiology: ECHOCARDIOGRAM COMPLETE  Result Date: 11/03/2021    ECHOCARDIOGRAM REPORT   Patient Name:   Carolyn Levine Date of Exam: 11/03/2021 Medical Rec #:  [id number]       Height:       65.0 in Accession #:    [id number]      Weight:       341.0 lb Date of Birth:  Nov 18, 1960       BSA:          2.482 m Patient Age:    61 years        BP:           111/56 mmHg Patient Gender: F               HR:           130 bpm. Exam Location:  [facility] Procedure: 2D Echo and Intracardiac Opacification Agent Indications:     Pulmonary Embolus I26.09  History:         Patient has prior history  of Echocardiogram examinations, most                  recent 04/29/2021.  Sonographer:     Kathlen Brunswick RDCS Referring Phys:  [id number] SARA-MAIZ A THOMAS Diagnosing Phys: Yolonda Kida MD  Sonographer Comments: Technically challenging study due to limited acoustic windows, no apical window, no subcostal window, patient is obese and no  parasternal window. Image acquisition challenging due to patient body habitus. IMPRESSIONS  1. Probably mod reduced LVF globally EF=30-35%?.  2. Very TDS Poor windows.  3. TDS. Left ventricular ejection fraction, by estimation, is 30 to 35%. The left ventricle has moderately decreased function. The left ventricle demonstrates regional wall motion abnormalities (see scoring diagram/findings for description). The left ventricular internal cavity size was moderately dilated. Left ventricular diastolic function could not be evaluated.  4. TDS. Right ventricular systolic function was not well visualized. The right ventricular size is moderately enlarged.  5. TDS.  6. The mitral valve was not well visualized. TDS mitral valve regurgitation.  7. Tricuspid valve regurgitation TDS.  8. The aortic valve was not well visualized. Unable to determine aortic valve morphology due to image quality. Aortic valve regurgitation TDS.  9. Pulmonic valve regurgitation TDS. Conclusion(s)/Recommendation(s): Poor windows for evaluation of left ventricular function by transthoracic echocardiography. Would recommend an alternative means of evaluation. No measurements or calculations were performed on this study. FINDINGS  Left Ventricle: TDS. Left ventricular ejection fraction, by estimation, is 30 to 35%. The left ventricle has moderately decreased function. The left ventricle demonstrates regional wall motion abnormalities. Definity contrast agent was given IV to delineate the left ventricular endocardial borders. The left ventricular internal cavity size was moderately dilated. There is no left  ventricular hypertrophy. Left ventricular diastolic function could not be evaluated. Right Ventricle: TDS. The right ventricular size is moderately enlarged. No increase in right ventricular wall thickness. Right ventricular systolic function was not well visualized. Left Atrium: TDS. Left atrial size was not well visualized. Right Atrium: Right atrial size was not well visualized. Pericardium: The pericardium was not well visualized. Mitral Valve: The mitral valve was not well visualized. TDS mitral valve regurgitation. Tricuspid Valve: The tricuspid valve is not well visualized. Tricuspid valve regurgitation TDS. Aortic Valve: The aortic valve was not well visualized. Aortic valve regurgitation TDS. Pulmonic Valve: The pulmonic valve was not well visualized. Pulmonic valve regurgitation TDS. Aorta: Aortic root could not be assessed. IAS/Shunts: The interatrial septum was not assessed. Additional Comments: Very TDS Poor windows. Probably mod reduced LVF globally EF=30-35%?Yolonda Kida MD Electronically signed by Yolonda Kida MD Signature Date/Time: 11/03/2021/11:32:17 AM    Final    CT Angio Chest PE W/Cm &/Or Wo Cm  Result Date: 11/02/2021 CLINICAL DATA:  Fever, cough, shortness of breath x2 days. Tachycardia. Hypoxia. EXAM: CT ANGIOGRAPHY CHEST WITH CONTRAST TECHNIQUE: Multidetector CT imaging of the chest was performed using the standard protocol during bolus administration of intravenous contrast. Multiplanar CT image reconstructions and MIPs were obtained to evaluate the vascular anatomy. RADIATION DOSE REDUCTION: This exam was performed according to the departmental dose-optimization program which includes automated exposure control, adjustment of the mA and/or kV according to patient size and/or use of iterative reconstruction technique. CONTRAST:  57m OMNIPAQUE IOHEXOL 350 MG/ML SOLN COMPARISON:  Chest radiograph dated 11/02/2021. FINDINGS: Cardiovascular: Suboptimal contrast opacification  in the pulmonary arteries, with preferential opacification of the aorta and left heart, suggesting suboptimal bolus timing. However, despite these limitations, the study is positive for multiple segmental and subsegmental pulmonary emboli in the left upper lobe (series 7/image 115), right upper lobe (series 7/images 93, 102, 111, 116, and 126), and in the left lower lobe and lingula (series 7/image 162). Overall clot burden is moderate. Enlargement of the main pulmonary artery, suggesting pulmonary arterial hypertension. Mildly enlarged RV to LV ratio, 1.08. However, given the lack central pulmonary embolism (lobar or above),  this is not considered specific for right heart strain. No evidence of thoracic aortic aneurysm or dissection. Cardiomegaly.  No pericardial effusion. Three vessel coronary atherosclerosis. Mediastinum/Nodes: No suspicious mediastinal lymphadenopathy. Visualized thyroid is unremarkable. Lungs/Pleura: Eventration right hemidiaphragm with associated right basilar scarring/atelectasis. Volume loss/atelectasis in the right middle lobe, chronic. Mild ground-glass opacity/mosaic attenuation in the lungs bilaterally, chronic. No findings suspicious for pulmonary infarct. No suspicious pulmonary nodules. No pleural effusion or pneumothorax. Upper Abdomen: Visualized upper abdomen is notable for prior cholecystectomy. Musculoskeletal: Degenerative changes of the visualized thoracolumbar spine. Review of the MIP images confirms the above findings. IMPRESSION: Multiple segmental and subsegmental bilateral pulmonary emboli, as above. Overall clot burden is moderate. Critical Value/emergent results were called by telephone at the time of interpretation on 11/02/2021 at 5:42 pm to provider [facility] , who verbally acknowledged these results. Electronically Signed   By: Julian Hy M.D.   On: 11/02/2021 17:47   DG Chest 2 View  Result Date: 11/02/2021 CLINICAL DATA:  Provided history: Cough,  shortness of breath. Fever for 2 days. Heart rate elevation. EXAM: CHEST - 2 VIEW COMPARISON:  CT angiogram chest 04/27/2021. Prior chest radiographs 04/27/2021. FINDINGS: Heart size within normal limits. Chronic elevation of the right hemidiaphragm. Mild ill-defined opacity within the adjacent right lung base with an appearance favoring atelectasis. No definite airspace consolidation. No evidence of pleural effusion or pneumothorax. No acute bony abnormality identified. IMPRESSION: Chronic elevation of the right hemidiaphragm. Mild ill-defined opacity within the adjacent right lung base with an appearance favoring atelectasis. No definite airspace consolidation. Electronically Signed   By: Kellie Simmering D.O.   On: 11/02/2021 14:18    ECHO 11/03/2021 very challenging image acquisition due to patient body habitus, but probably moderately reduced LVEF of 30-35%  TELEMETRY reviewed by me (LT) 11/07/2021 : Sinus rhythm to sinus tachycardia rate 90s to the low 100s with short paroxysms into the 110s  EKG reviewed by me: 8/25 sinus tach 143 RBBB   Data reviewed by me (LT) 11/07/2021: Hospitalist progress note, CBC, BNP, vitals, telemetry, discussed with hospitalist  ASSESSMENT AND PLAN:   Carolyn Levine is a 19yoF with a PMH of hypertension, hyperlipidemia, type 2 diabetes, OSA on CPAP, morbid obesity who presented to [facility] ED 11/02/2021 with a cough and shortness of breath for 1 to 2 days.  She was found to have bilateral segmental and subsegmental PEs, reviewed by IR and vascular surgery and thrombectomy was not indicated.  She met sepsis criteria on admission, preliminary blood cultures growing Enterobacter species, urine culture growing Klebsiella ornitholytica.  Troponin peaked at 2900 on 8/26, cardiology is consulted for further assistance.  #Sepsis secondary to klebsiella UTI #enterobacter bacteremia  Leukocytosis improving from 38 on 8/25-13.7 on 8/28, urine culture positive for Klebsiella, repeat blood  cultures on 8/28 without growth after 1 day. -On p.o. levofloxacin daily until 9/3  #Acute bilateral pulmonary embolus S/p heparin drip, hemodynamically stable, on room air without significant shortness of breath.   -Per hospitalist, CTA films reviewed by Dr[facility] Larena Glassman at [facility] who felt imaging was more c/w pulmonary hypertension -Started on Eliquis 8/28  #Elevated troponin, probable NSTEMI Troponin peaked at 2900 on 8/26, remains chest pain-free. -Continue heparin drip for treatment of PE as above, transition to Eliquis likely at discharge. -Continue aspirin 81 mg daily -Continue metoprolol to 25 mg twice daily -Per patient, only tolerates Crestor 5 mg every 3 days due to myalgias (also reports myalgias with simvastatin)  -We will plan for  R/LHC for evaluation of pulmonary hypertension/ischemia on an outpatient basis after follow-up with her regular cardiologist Dr. Nehemiah Massed in 1 week.  #New diagnosis of HFrEF (LVEF 30 to 35% 10/2021) Very technically difficult echo, but likely reduced EF as above. Difficult to assess volume status, but appears somewhat volume up in her lower extremities with trace pitting pedal edema, and having orthopnea overnight.  BNP negative at 71. -GDMT with metoprolol, restart lisinopril at low-dose 2.5 mg today.   -S/p IV Lasix 20 mg x 2 doses with clinical improvement, no further orthopnea or PND overnight.   -We will give 20 mg of p.o. Lasix daily starting today for a total of 5 days. -Consider spironolactone as her blood pressure allows and renal function improves.  Defer Jardiance in the setting of UTI and AKI.  #AKI Baseline renal function from 8/4 creatinine 0.7 and GFR >60. Continues to improve after hydration and holding ACEi.  On 8/28 1.09 and 47.  8/27 creatinine 1.37 GFR 44 (was 1.63 and 35 on admission)  This patient's plan of care was discussed and created with Dr. Clayborn Bigness and he is in agreement.  Signed: Tristan Schroeder ,  PA-C 11/07/2021, 8:25 AM [facility] Cardiology

## 2021-11-07 NOTE — TOC Transition Note (Signed)
Transition of Care [facility]) - CM/SW Discharge Note   Patient Details  Name: Carolyn Levine MRN: [id number] Date of Birth: 10-18-1960  Transition of Care [facility]) CM/SW Contact:  Candie Chroman, LCSW Phone Number: 11/07/2021, 12:22 PM   Clinical Narrative:   Patient has orders to discharge home today. Per [employer] representative, RW has been delivered. Per pharmacy, Eliquis coupon was given yesterday. No further concerns. CSW signing off.  Final next level of care: OP Rehab Barriers to Discharge: Barriers Resolved   Patient Goals and CMS Choice     Choice offered to / list presented to : Patient  Discharge Placement                Patient to be transferred to facility by: Husband   Patient and family notified of of transfer: 11/07/21  Discharge Plan and Services     Post Acute Care Choice: Durable Medical Equipment (Outpatient PT)          DME Arranged: Gilford Rile rolling DME Agency: [employer] Date DME Agency Contacted: 11/07/21   Representative spoke with at DME Agency: Suanne Marker            Social Determinants of Health ([place]) Interventions     Readmission Risk Interventions     No data to display

## 2021-11-07 NOTE — Consult Note (Signed)
   [facility] CM Inpatient Consult   11/07/2021  Carolyn Levine May 20, 1960 [id number]  [facility] [ACO] Patient: [name] Liaison coverage for: Follow up: [facility] remote call  Unable to reach patient by phone, patient transitioned home.  Chart reviewed for post hospital needs as reviewed, patient transitioning home, for OP rehab noted for post hospital therapy.  Plan: Will make a referral request [facility] RNCM for follow up for post hospital transition for plan support and transition of care needs.  [name], RN BSN [facility] Liaison  [phone] business mobile phone Toll free office [phone]  Fax number: [phone] [email or web address]

## 2021-11-07 NOTE — Discharge Instructions (Addendum)
Information on my medicine - ELIQUIS (apixaban)  This medication education was reviewed with me or my healthcare representative as part of my discharge preparation.  The pharmacist that spoke with me during my hospital stay was:  Dallie Piles, [facility]  Why was Eliquis prescribed for you? Eliquis was prescribed for you to reduce the risk of forming blood clots that can cause a stroke if you have a medical condition called atrial fibrillation (a type of irregular heartbeat) OR to reduce the risk of a blood clots forming after orthopedic surgery.  What do You need to know about Eliquis ? Take your Eliquis TWICE DAILY - one tablet in the morning and one tablet in the evening with or without food.  It would be best to take the doses about the same time each day.  If you have difficulty swallowing the tablet whole please discuss with your pharmacist how to take the medication safely.  Take Eliquis exactly as prescribed by your doctor and DO NOT stop taking Eliquis without talking to the doctor who prescribed the medication.  Stopping may increase your risk of developing a new clot or stroke.  Refill your prescription before you run out.  After discharge, you should have regular check-up appointments with your healthcare provider that is prescribing your Eliquis.  In the future your dose may need to be changed if your kidney function or weight changes by a significant amount or as you get older.  What do you do if you miss a dose? If you miss a dose, take it as soon as you remember on the same day and resume taking twice daily.  Do not take more than one dose of ELIQUIS at the same time.  Important Safety Information A possible side effect of Eliquis is bleeding. You should call your healthcare provider right away if you experience any of the following: Bleeding from an injury or your nose that does not stop. Unusual colored urine (red or dark brown) or unusual colored stools (red or  black). Unusual bruising for unknown reasons. A serious fall or if you hit your head (even if there is no bleeding).  Some medicines may interact with Eliquis and might increase your risk of bleeding or clotting while on Eliquis. To help avoid this, consult your healthcare provider or pharmacist prior to using any new prescription or non-prescription medications, including herbals, vitamins, non-steroidal anti-inflammatory drugs (NSAIDs) and supplements.  This website has more information on Eliquis (apixaban): http://www.eliquis.com/eliquis/home

## 2021-11-08 ENCOUNTER — Other Ambulatory Visit: Payer: Self-pay | Admitting: *Deleted

## 2021-11-08 ENCOUNTER — Encounter: Payer: Self-pay | Admitting: *Deleted

## 2021-11-08 DIAGNOSIS — B9689 Other specified bacterial agents as the cause of diseases classified elsewhere: Secondary | ICD-10-CM | POA: Diagnosis present

## 2021-11-08 DIAGNOSIS — R7881 Bacteremia: Secondary | ICD-10-CM

## 2021-11-08 NOTE — Discharge Summary (Addendum)
Physician Discharge Summary   Patient: Carolyn Levine MRN: [id number] DOB: 02/27/1961  Admit date:     11/02/2021  Discharge date: 11/07/2021  Discharge Physician: Jennye Boroughs   PCP: [name], MD   Recommendations at discharge:   Follow-up with Dr. Nehemiah Massed, cardiologist, in 1 week Follow-up with PCP in 1 week  Discharge Diagnoses: Principal Problem:   Pulmonary emboli [facility]) Active Problems:   Severe sepsis ([place])   NSTEMI (non-ST elevated myocardial infarction) ([place])   Acute HFrEF (heart failure with reduced ejection fraction) ([facility])   AKI (acute kidney injury) ([place])   Type 2 diabetes mellitus with hyperlipidemia ([place])   Hyperlipidemia, unspecified   Hypertension   Morbid obesity with BMI of 50.0-59.9, adult ([place])   Bacteremia due to Gram-negative bacteria   UTI due to Klebsiella species  Resolved Problems:   * No resolved hospital problems. *  Hospital Course:  Carolyn Levine is a 61 y.o. female with medical history significant of hypertension, type II DM, hyperlipidemia, morbid obesity ,OSA on CPAP at night, basal cell cancer who presented today hospital with 1 to 2-day history of cough and shortness of breath.  She had COVID-19 infection in February 2023 and ever since then she is out shortness of breath on exertion at baseline.    ED course.  On arrival patient was tachycardic, tachypneic, borderline blood pressure and mildly hypoxic requiring up to 2 L of oxygen.  Labs pertinent for significant leukocytosis with WBC of 38.2, hemoglobin of 11.7, AKI with BUN of 28 and creatinine of 1.63 with baseline less than 1.  T. bili started trending up at 1.4. Lactic acid of 3.3, procalcitonin at 48.56, troponin 727>>1088, and BNP of 534.  D-dimer of 15.58 UA with protein urea and large leukocytes along with many bacteria.  Urine cultures pending. Chest x-ray with chronic elevation of left hemidiaphragm.  Mild ill-defined opacity within the adjacent right lung base with an  appearance of favoring atelectasis and no definite airspace consolidation. CTA chest with multiple segmental and subsegmental bilateral PE, overall clot burden is moderate.  Mildly enlarged RV to LV ratio, 1.08, may be mild right heart strain, not considered specific due to lack of central pulmonary embolism.    She was admitted to the hospital for acute pulmonary embolism, acute NSTEMI and acute systolic heart failure.  2D echo showed EF estimated at 30 to 35%.  She was also found to have severe sepsis secondary to Klebsiella ornithinolytica UTI and bacteremia complicated by AKI.  She was treated with IV heparin drip, IV fluids and empiric IV antibiotics.  IV fluids were discontinued and she was later given IV Lasix for CHF with peripheral edema.  She was evaluated by the cardiologist who will arrange cardiac catheterization in the outpatient setting for further work-up.  Her condition has improved and she is deemed stable for discharge to home.  Discharge plan was discussed with the patient and mom, sister at the bedside.  All new medications were discussed.            Consultants: Cardiologist Procedures performed: None Disposition: Home Diet recommendation:  Discharge Diet Orders (From admission, onward)     Start     Ordered   11/07/21 0000  Diet - low sodium heart healthy        11/07/21 1210   11/07/21 0000  Diet Carb Modified        11/07/21 1210           Cardiac and Carb  modified diet DISCHARGE MEDICATION: Allergies as of 11/07/2021       Reactions   Codeine Sulfate    Simvastatin    Other reaction(s): Muscle Pain   Sulphadimidine [sulfamethazine]         Medication List     STOP taking these medications    phentermine 37.5 MG tablet Commonly known as: ADIPEX-P   pioglitazone 30 MG tablet Commonly known as: ACTOS       TAKE these medications    aspirin 81 MG chewable tablet Chew and swallow 1 tablet (81 mg total) by mouth daily. (Chew 1 tablet  (81 mg total) by mouth daily.)   benzonatate 200 MG capsule Commonly known as: TESSALON Take 1 capsule (200 mg total) by mouth 3 (three) times daily as needed for cough.   Eliquis DVT/PE Starter Pack Generic drug: Apixaban Starter Pack ('10mg'$  and '5mg'$ ) Take as directed on package: Start with two-'5mg'$  tablets by mouth twice daily for 7 days. On day 8, switch to one-'5mg'$  tablet twice daily. (Take as directed on package: start with two-'5mg'$  tablets twice daily for 7 days. On day 8, switch to one-'5mg'$  tablet twice daily.)   furosemide 20 MG tablet Commonly known as: LASIX Take 1 tablet (20 mg total) by mouth daily for 7 doses.   guaiFENesin-dextromethorphan 100-10 MG/5ML syrup Commonly known as: ROBITUSSIN DM Take 5 mLs by mouth every 4 (four) hours as needed for cough.   levofloxacin 750 MG tablet Commonly known as: LEVAQUIN Take 1 tablet (750 mg total) by mouth daily for 2 days.   lisinopril 2.5 MG tablet Commonly known as: ZESTRIL Take 1 tablet (2.5 mg total) by mouth daily. What changed:  medication strength how much to take how to take this when to take this   metFORMIN 1000 MG tablet Commonly known as: GLUCOPHAGE Take 1 tablet (1,000 mg total) by mouth 2 (two) times daily   metoprolol tartrate 25 MG tablet Commonly known as: LOPRESSOR Take 1 tablet (25 mg total) by mouth 2 (two) times daily.   rosuvastatin 5 MG tablet Commonly known as: CRESTOR Take 1 tablet (5 mg total) by mouth once daily   traZODone 50 MG tablet Commonly known as: DESYREL Take 1 tablet (50 mg total) by mouth at bedtime        Follow-up Information     Corey Skains, MD. Go in 1 week(s).   Specialty: Cardiology Why: Appointment on Friday, 11/16/2021 at 1:45pm Contact information: [facility] [place] [address] [phone]                Discharge Exam: Danley Danker Weights   11/02/21 1316 11/06/21 0500 11/07/21 0648  Weight: (!) 154.7 kg  (!) 158 kg (!) 156 kg   GEN: NAD SKIN: Warm and dry EYES: No pallor or icterus ENT: MMM CV: RRR PULM: CTA B ABD: soft, obese, NT, +BS CNS: AAO x 3, non focal EXT: B/l leg and pedal edema, no tenderness   Condition at discharge: good  The results of significant diagnostics from this hospitalization (including imaging, microbiology, ancillary and laboratory) are listed below for reference.   Imaging Studies: ECHOCARDIOGRAM COMPLETE  Result Date: 11/03/2021    ECHOCARDIOGRAM REPORT   Patient Name:   Carolyn Levine Chahal Date of Exam: 11/03/2021 Medical Rec #:  [id number]       Height:       65.0 in Accession #:    [id number]      Weight:  341.0 lb Date of Birth:  1960-12-14       BSA:          2.482 m Patient Age:    8 years        BP:           111/56 mmHg Patient Gender: F               HR:           130 bpm. Exam Location:  [facility] Procedure: 2D Echo and Intracardiac Opacification Agent Indications:     Pulmonary Embolus I26.09  History:         Patient has prior history of Echocardiogram examinations, most                  recent 04/29/2021.  Sonographer:     Kathlen Brunswick RDCS Referring Phys:  [id number] SARA-MAIZ A THOMAS Diagnosing Phys: Yolonda Kida MD  Sonographer Comments: Technically challenging study due to limited acoustic windows, no apical window, no subcostal window, patient is obese and no parasternal window. Image acquisition challenging due to patient body habitus. IMPRESSIONS  1. Probably mod reduced LVF globally EF=30-35%?.  2. Very TDS Poor windows.  3. TDS. Left ventricular ejection fraction, by estimation, is 30 to 35%. The left ventricle has moderately decreased function. The left ventricle demonstrates regional wall motion abnormalities (see scoring diagram/findings for description). The left ventricular internal cavity size was moderately dilated. Left ventricular diastolic function could not be evaluated.  4. TDS. Right ventricular systolic function was not well  visualized. The right ventricular size is moderately enlarged.  5. TDS.  6. The mitral valve was not well visualized. TDS mitral valve regurgitation.  7. Tricuspid valve regurgitation TDS.  8. The aortic valve was not well visualized. Unable to determine aortic valve morphology due to image quality. Aortic valve regurgitation TDS.  9. Pulmonic valve regurgitation TDS. Conclusion(s)/Recommendation(s): Poor windows for evaluation of left ventricular function by transthoracic echocardiography. Would recommend an alternative means of evaluation. No measurements or calculations were performed on this study. FINDINGS  Left Ventricle: TDS. Left ventricular ejection fraction, by estimation, is 30 to 35%. The left ventricle has moderately decreased function. The left ventricle demonstrates regional wall motion abnormalities. Definity contrast agent was given IV to delineate the left ventricular endocardial borders. The left ventricular internal cavity size was moderately dilated. There is no left ventricular hypertrophy. Left ventricular diastolic function could not be evaluated. Right Ventricle: TDS. The right ventricular size is moderately enlarged. No increase in right ventricular wall thickness. Right ventricular systolic function was not well visualized. Left Atrium: TDS. Left atrial size was not well visualized. Right Atrium: Right atrial size was not well visualized. Pericardium: The pericardium was not well visualized. Mitral Valve: The mitral valve was not well visualized. TDS mitral valve regurgitation. Tricuspid Valve: The tricuspid valve is not well visualized. Tricuspid valve regurgitation TDS. Aortic Valve: The aortic valve was not well visualized. Aortic valve regurgitation TDS. Pulmonic Valve: The pulmonic valve was not well visualized. Pulmonic valve regurgitation TDS. Aorta: Aortic root could not be assessed. IAS/Shunts: The interatrial septum was not assessed. Additional Comments: Very TDS Poor windows.  Probably mod reduced LVF globally EF=30-35%?Yolonda Kida MD Electronically signed by Yolonda Kida MD Signature Date/Time: 11/03/2021/11:32:17 AM    Final    CT Angio Chest PE W/Cm &/Or Wo Cm  Result Date: 11/02/2021 CLINICAL DATA:  Fever, cough, shortness of breath x2 days. Tachycardia. Hypoxia. EXAM: CT ANGIOGRAPHY  CHEST WITH CONTRAST TECHNIQUE: Multidetector CT imaging of the chest was performed using the standard protocol during bolus administration of intravenous contrast. Multiplanar CT image reconstructions and MIPs were obtained to evaluate the vascular anatomy. RADIATION DOSE REDUCTION: This exam was performed according to the departmental dose-optimization program which includes automated exposure control, adjustment of the mA and/or kV according to patient size and/or use of iterative reconstruction technique. CONTRAST:  61m OMNIPAQUE IOHEXOL 350 MG/ML SOLN COMPARISON:  Chest radiograph dated 11/02/2021. FINDINGS: Cardiovascular: Suboptimal contrast opacification in the pulmonary arteries, with preferential opacification of the aorta and left heart, suggesting suboptimal bolus timing. However, despite these limitations, the study is positive for multiple segmental and subsegmental pulmonary emboli in the left upper lobe (series 7/image 115), right upper lobe (series 7/images 93, 102, 111, 116, and 126), and in the left lower lobe and lingula (series 7/image 162). Overall clot burden is moderate. Enlargement of the main pulmonary artery, suggesting pulmonary arterial hypertension. Mildly enlarged RV to LV ratio, 1.08. However, given the lack central pulmonary embolism (lobar or above), this is not considered specific for right heart strain. No evidence of thoracic aortic aneurysm or dissection. Cardiomegaly.  No pericardial effusion. Three vessel coronary atherosclerosis. Mediastinum/Nodes: No suspicious mediastinal lymphadenopathy. Visualized thyroid is unremarkable. Lungs/Pleura:  Eventration right hemidiaphragm with associated right basilar scarring/atelectasis. Volume loss/atelectasis in the right middle lobe, chronic. Mild ground-glass opacity/mosaic attenuation in the lungs bilaterally, chronic. No findings suspicious for pulmonary infarct. No suspicious pulmonary nodules. No pleural effusion or pneumothorax. Upper Abdomen: Visualized upper abdomen is notable for prior cholecystectomy. Musculoskeletal: Degenerative changes of the visualized thoracolumbar spine. Review of the MIP images confirms the above findings. IMPRESSION: Multiple segmental and subsegmental bilateral pulmonary emboli, as above. Overall clot burden is moderate. Critical Value/emergent results were called by telephone at the time of interpretation on 11/02/2021 at 5:42 pm to [facility], who verbally acknowledged these results. Electronically Signed   By: SJulian HyM.D.   On: 11/02/2021 17:47   DG Chest 2 View  Result Date: 11/02/2021 CLINICAL DATA:  Provided history: Cough, shortness of breath. Fever for 2 days. Heart rate elevation. EXAM: CHEST - 2 VIEW COMPARISON:  CT angiogram chest 04/27/2021. Prior chest radiographs 04/27/2021. FINDINGS: Heart size within normal limits. Chronic elevation of the right hemidiaphragm. Mild ill-defined opacity within the adjacent right lung base with an appearance favoring atelectasis. No definite airspace consolidation. No evidence of pleural effusion or pneumothorax. No acute bony abnormality identified. IMPRESSION: Chronic elevation of the right hemidiaphragm. Mild ill-defined opacity within the adjacent right lung base with an appearance favoring atelectasis. No definite airspace consolidation. Electronically Signed   By: KKellie SimmeringD.O.   On: 11/02/2021 14:18    Microbiology: Results for orders placed or performed during the hospital encounter of 11/02/21  Resp Panel by RT-PCR (Flu A&B, Covid) Anterior Nasal Swab     Status: None   Collection Time:  11/02/21  2:44 PM   Specimen: Anterior Nasal Swab  Result Value Ref Range Status   SARS Coronavirus 2 by RT PCR NEGATIVE NEGATIVE Final    Comment: (NOTE) SARS-CoV-2 target nucleic acids are NOT DETECTED.  The SARS-CoV-2 RNA is generally detectable in upper respiratory specimens during the acute phase of infection. The lowest concentration of SARS-CoV-2 viral copies this assay can detect is 138 copies/mL. A negative result does not preclude SARS-Cov-2 infection and should not be used as the sole basis for treatment or other patient management decisions. A negative result may  occur with  improper specimen collection/handling, submission of specimen other than nasopharyngeal swab, presence of viral mutation(s) within the areas targeted by this assay, and inadequate number of viral copies(<138 copies/mL). A negative result must be combined with clinical observations, patient history, and epidemiological information. The expected result is Negative.  Fact Sheet for Patients:  EntrepreneurPulse.com.au  Fact Sheet for Healthcare Providers:  IncredibleEmployment.be  This test is no t yet approved or cleared by the [place] FDA and  has been authorized for detection and/or diagnosis of SARS-CoV-2 by FDA under an Emergency Use Authorization (EUA). This EUA will remain  in effect (meaning this test can be used) for the duration of the COVID-19 declaration under Section 564(b)(1) of the Act, 21 U.S.C.section 360bbb-3(b)(1), unless the authorization is terminated  or revoked sooner.       Influenza A by PCR NEGATIVE NEGATIVE Final   Influenza B by PCR NEGATIVE NEGATIVE Final    Comment: (NOTE) The Xpert Xpress SARS-CoV-2/FLU/RSV plus assay is intended as an aid in the diagnosis of influenza from Nasopharyngeal swab specimens and should not be used as a sole basis for treatment. Nasal washings and aspirates are unacceptable for Xpert Xpress  SARS-CoV-2/FLU/RSV testing.  Fact Sheet for Patients: EntrepreneurPulse.com.au  Fact Sheet for Healthcare Providers: IncredibleEmployment.be  This test is not yet approved or cleared by the [place] FDA and has been authorized for detection and/or diagnosis of SARS-CoV-2 by FDA under an Emergency Use Authorization (EUA). This EUA will remain in effect (meaning this test can be used) for the duration of the COVID-19 declaration under Section 564(b)(1) of the Act, 21 U.S.C. section 360bbb-3(b)(1), unless the authorization is terminated or revoked.  Performed at [facility], [address] [place], [place] [address]   Urine Culture     Status: Abnormal   Collection Time: 11/02/21  9:04 PM   Specimen: Urine, Clean Catch  Result Value Ref Range Status   Specimen Description   Final    URINE, CLEAN CATCH Performed at [facility], [address] [place], [place] [address]    Special Requests   Final    NONE Performed at [facility], [place], [place] [address]    Culture >=100,000 COLONIES/mL KLEBSIELLA ORNITHINOLYTICA (A)  Final   Report Status 11/05/2021 FINAL  Final   Organism ID, Bacteria KLEBSIELLA ORNITHINOLYTICA (A)  Final      Susceptibility   Klebsiella ornithinolytica - MIC*    AMPICILLIN >=32 RESISTANT Resistant     CEFAZOLIN <=4 SENSITIVE Sensitive     CEFEPIME <=0.12 SENSITIVE Sensitive     CEFTRIAXONE <=0.25 SENSITIVE Sensitive     CIPROFLOXACIN <=0.25 SENSITIVE Sensitive     GENTAMICIN <=1 SENSITIVE Sensitive     IMIPENEM 0.5 SENSITIVE Sensitive     NITROFURANTOIN 64 INTERMEDIATE Intermediate     TRIMETH/SULFA <=20 SENSITIVE Sensitive     AMPICILLIN/SULBACTAM 4 SENSITIVE Sensitive     PIP/TAZO <=4 SENSITIVE Sensitive     * >=100,000 COLONIES/mL KLEBSIELLA ORNITHINOLYTICA  Culture, blood (Routine X 2) w Reflex to ID Panel     Status: Abnormal   Collection Time: 11/02/21  10:26 PM   Specimen: BLOOD RIGHT ARM  Result Value Ref Range Status   Specimen Description BLOOD RIGHT ARM  Final   Special Requests   Final    BOTTLES DRAWN AEROBIC AND ANAEROBIC Blood Culture adequate volume   Culture  Setup Time   Final    GRAM NEGATIVE RODS IN BOTH AEROBIC  AND ANAEROBIC BOTTLES Organism ID to follow CRITICAL RESULT CALLED TO, READ BACK BY AND VERIFIED WITH: WILL ANDERSON 11/03/21 1126 SLM    Culture KLEBSIELLA ORNITHINOLYTICA (A)  Final   Report Status 11/05/2021 FINAL  Final   Organism ID, Bacteria KLEBSIELLA ORNITHINOLYTICA  Final      Susceptibility   Klebsiella ornithinolytica - MIC*    AMPICILLIN RESISTANT Resistant     CEFAZOLIN <=4 SENSITIVE Sensitive     CEFEPIME <=0.12 SENSITIVE Sensitive     CEFTAZIDIME <=1 SENSITIVE Sensitive     CEFTRIAXONE <=0.25 SENSITIVE Sensitive     CIPROFLOXACIN <=0.25 SENSITIVE Sensitive     GENTAMICIN <=1 SENSITIVE Sensitive     IMIPENEM 0.5 SENSITIVE Sensitive     TRIMETH/SULFA <=20 SENSITIVE Sensitive     AMPICILLIN/SULBACTAM 4 SENSITIVE Sensitive     PIP/TAZO <=4 SENSITIVE Sensitive     * KLEBSIELLA ORNITHINOLYTICA  Blood Culture ID Panel (Reflexed)     Status: Abnormal   Collection Time: 11/02/21 10:26 PM  Result Value Ref Range Status   Enterococcus faecalis NOT DETECTED NOT DETECTED Final   Enterococcus Faecium NOT DETECTED NOT DETECTED Final   Listeria monocytogenes NOT DETECTED NOT DETECTED Final   Staphylococcus species NOT DETECTED NOT DETECTED Final   Staphylococcus aureus (BCID) NOT DETECTED NOT DETECTED Final   Staphylococcus epidermidis NOT DETECTED NOT DETECTED Final   Staphylococcus lugdunensis NOT DETECTED NOT DETECTED Final   Streptococcus species NOT DETECTED NOT DETECTED Final   Streptococcus agalactiae NOT DETECTED NOT DETECTED Final   Streptococcus pneumoniae NOT DETECTED NOT DETECTED Final   Streptococcus pyogenes NOT DETECTED NOT DETECTED Final   A.calcoaceticus-baumannii NOT DETECTED NOT  DETECTED Final   Bacteroides fragilis NOT DETECTED NOT DETECTED Final   Enterobacterales DETECTED (A) NOT DETECTED Final    Comment: Enterobacterales represent a large order of gram negative bacteria, not a single organism. Refer to culture for further identification. CRITICAL RESULT CALLED TO, READ BACK BY AND VERIFIED WITH: WILL ANDERSON 11/03/21 1126 SLM    Enterobacter cloacae complex NOT DETECTED NOT DETECTED Final   Escherichia coli NOT DETECTED NOT DETECTED Final   Klebsiella aerogenes NOT DETECTED NOT DETECTED Final   Klebsiella oxytoca NOT DETECTED NOT DETECTED Final   Klebsiella pneumoniae NOT DETECTED NOT DETECTED Final   Proteus species NOT DETECTED NOT DETECTED Final   Salmonella species NOT DETECTED NOT DETECTED Final   Serratia marcescens NOT DETECTED NOT DETECTED Final   Haemophilus influenzae NOT DETECTED NOT DETECTED Final   Neisseria meningitidis NOT DETECTED NOT DETECTED Final   Pseudomonas aeruginosa NOT DETECTED NOT DETECTED Final   Stenotrophomonas maltophilia NOT DETECTED NOT DETECTED Final   Candida albicans NOT DETECTED NOT DETECTED Final   Candida auris NOT DETECTED NOT DETECTED Final   Candida glabrata NOT DETECTED NOT DETECTED Final   Candida krusei NOT DETECTED NOT DETECTED Final   Candida parapsilosis NOT DETECTED NOT DETECTED Final   Candida tropicalis NOT DETECTED NOT DETECTED Final   Cryptococcus neoformans/gattii NOT DETECTED NOT DETECTED Final   CTX-M ESBL NOT DETECTED NOT DETECTED Final   Carbapenem resistance IMP NOT DETECTED NOT DETECTED Final   Carbapenem resistance KPC NOT DETECTED NOT DETECTED Final   Carbapenem resistance NDM NOT DETECTED NOT DETECTED Final   Carbapenem resist OXA 48 LIKE NOT DETECTED NOT DETECTED Final   Carbapenem resistance VIM NOT DETECTED NOT DETECTED Final    Comment: Performed at [facility], [address] [place], [place] [address]  Culture, blood (Routine X 2) w Reflex  to ID Panel     Status: Abnormal    Collection Time: 11/02/21 11:56 PM   Specimen: BLOOD RIGHT HAND  Result Value Ref Range Status   Specimen Description   Final    BLOOD RIGHT HAND Performed at [facility], [address], [place], [place] [address]    Special Requests   Final    BOTTLES DRAWN AEROBIC AND ANAEROBIC Blood Culture adequate volume Performed at [address] [place], [place] [address]    Culture  Setup Time   Final    GRAM NEGATIVE RODS IN BOTH AEROBIC AND ANAEROBIC BOTTLES CRITICAL RESULT CALLED TO, READ BACK BY AND VERIFIED WITH: WILL ANDERSON 11/03/21 1126 SLM Performed at [facility], [address] [place], [place] [address]    Culture (A)  Final    KLEBSIELLA ORNITHINOLYTICA SUSCEPTIBILITIES PERFORMED ON PREVIOUS CULTURE WITHIN THE LAST 5 DAYS. Performed at [facility], [address] [place], [place] [address]    Report Status 11/05/2021 FINAL  Final  MRSA Next Gen by PCR, Nasal     Status: None   Collection Time: 11/03/21  1:57 AM   Specimen: Nasal Mucosa; Nasal Swab  Result Value Ref Range Status   MRSA by PCR Next Gen NOT DETECTED NOT DETECTED Final    Comment: (NOTE) The GeneXpert MRSA Assay (FDA approved for NASAL specimens only), is one component of a comprehensive MRSA colonization surveillance program. It is not intended to diagnose MRSA infection nor to guide or monitor treatment for MRSA infections. Test performance is not FDA approved in patients less than 47 years old. Performed at [facility], [address] [place], [place] [address]   Culture, blood (Routine X 2) w Reflex to ID Panel     Status: None (Preliminary result)   Collection Time: 11/05/21 12:50 AM   Specimen: BLOOD  Result Value Ref Range Status   Specimen Description BLOOD RIGHT [facility]  Final   Special Requests   Final    BOTTLES DRAWN AEROBIC AND ANAEROBIC Blood Culture adequate volume   Culture   Final    NO GROWTH 3 DAYS Performed at [facility], [address] [place], [place] [address]    Report Status PENDING  Incomplete  Culture, blood (Routine X 2) w Reflex to ID Panel     Status: None (Preliminary result)   Collection Time: 11/05/21 12:56 AM   Specimen: BLOOD  Result Value Ref Range Status   Specimen Description BLOOD RIGHT [facility]  Final   Special Requests   Final    BOTTLES DRAWN AEROBIC AND ANAEROBIC Blood Culture adequate volume   Culture   Final    NO GROWTH 3 DAYS Performed at [facility], [address] [place], [place] [address]    Report Status PENDING  Incomplete    Labs: CBC: Recent Labs  Lab 11/03/21 0454 11/03/21 1227 11/04/21 0333 11/05/21 0426 11/06/21 0438 11/07/21 0447  WBC 25.2*  --  22.3* 13.7* 10.9* 13.0*  HGB 11.2*  --  10.8* 9.5* 10.1* 10.1*  HCT 35.1*  --  33.7* 29.7* 31.8* 31.7*  MCV 90.5  --  89.4 90.3 89.6 89.3  PLT 130* 133* 136* 134* 158 660   Basic Metabolic Panel: Recent Labs  Lab 11/02/21 1531 11/02/21 2104 11/03/21 0454 11/04/21 0333 11/05/21 1205  NA 139 140 136 136 137  K 3.8 4.4 3.9 3.8 4.0  CL 104 105 107 106 104  CO2  21* 19* '23 22 23  '$ GLUCOSE 214* 191* 159* 138* 146*  BUN 28* 30* 32* 42* 42*  CREATININE 1.63* 1.60* 1.35* 1.37* 1.09*  CALCIUM 8.9 8.8* 8.0* 7.9* 8.4*   Liver Function Tests: Recent Labs  Lab 11/03/21 0454 11/04/21 0333  AST 41 30  ALT 27 22  ALKPHOS 63 75  BILITOT 1.4* 1.4*  PROT 7.1 7.2  ALBUMIN 3.2* 2.9*   CBG: Recent Labs  Lab 11/06/21 0815 11/06/21 1217 11/06/21 1628 11/07/21 0828 11/07/21 1223  GLUCAP 133* 174* 116* 172* 144*    Discharge time spent: greater than 30 minutes.  Signed: Jennye Boroughs, MD [facility] 11/08/2021

## 2021-11-08 NOTE — Patient Outreach (Signed)
  Care Coordination TOC Note Transition Care Management Follow-up Telephone Call Date of discharge and from where: 8/30 from [facility] How have you been since you were released from the hospital? Better Any questions or concerns? No  Items Reviewed: Did the pt receive and understand the discharge instructions provided? Yes  Medications obtained and verified? Yes  Other? No  Any new allergies since your discharge? No  Dietary orders reviewed? Yes Do you have support at home? Yes   Home Care and Equipment/Supplies: Were home health services ordered? no If so, what is the name of the agency? N/a  Has the agency set up a time to come to the patient's home? not applicable Were any new equipment or medical supplies ordered?  Yes: [employer] What is the name of the medical supply agency? [employer] Were you able to get the supplies/equipment? yes Do you have any questions related to the use of the equipment or supplies? No  Functional Questionnaire: (I = Independent and D = Dependent) ADLs: I  Bathing/Dressing- I  Meal Prep- I  Eating- I  Maintaining continence- I  Transferring/Ambulation- I  Managing Meds- I  Follow up appointments reviewed:  PCP Hospital f/u appt confirmed? No  She will call PCP [facility] f/u appt confirmed? Yes  Scheduled to see Cardiology on 9/8. Are transportation arrangements needed? No  If their condition worsens, is the pt aware to call PCP or go to the Emergency Dept.? Yes Was the patient provided with contact information for the PCP's office or ED? Yes Was to pt encouraged to call back with questions or concerns? Yes  SDOH assessments and interventions completed:   Yes  Care Coordination Interventions Activated:  Yes   Care Coordination Interventions:  PCP follow up appointment requested    Encounter Outcome:  Pt. Visit Completed    Valente David, RN, MSN, [place] Management Care Management Coordinator [phone]

## 2021-11-09 ENCOUNTER — Other Ambulatory Visit: Payer: Self-pay

## 2021-11-10 LAB — CULTURE, BLOOD (ROUTINE X 2)
Culture: NO GROWTH
Culture: NO GROWTH
Special Requests: ADEQUATE
Special Requests: ADEQUATE

## 2021-11-16 ENCOUNTER — Other Ambulatory Visit: Payer: Self-pay

## 2021-11-16 DIAGNOSIS — E782 Mixed hyperlipidemia: Secondary | ICD-10-CM | POA: Diagnosis not present

## 2021-11-16 DIAGNOSIS — E119 Type 2 diabetes mellitus without complications: Secondary | ICD-10-CM | POA: Diagnosis not present

## 2021-11-16 DIAGNOSIS — I10 Essential (primary) hypertension: Secondary | ICD-10-CM | POA: Diagnosis not present

## 2021-11-16 DIAGNOSIS — I2694 Multiple subsegmental pulmonary emboli without acute cor pulmonale: Secondary | ICD-10-CM | POA: Diagnosis not present

## 2021-11-16 MED ORDER — FUROSEMIDE 20 MG PO TABS
ORAL_TABLET | ORAL | 4 refills | Status: DC
  Filled 2021-11-16 (×2): qty 90, 90d supply, fill #0
  Filled 2021-11-22: qty 11, 11d supply, fill #1
  Filled 2022-02-03: qty 90, 90d supply, fill #1
  Filled 2022-05-04: qty 90, 90d supply, fill #2
  Filled 2022-08-16: qty 90, 90d supply, fill #3
  Filled 2022-11-11: qty 90, 90d supply, fill #4

## 2021-11-21 ENCOUNTER — Other Ambulatory Visit: Payer: Self-pay

## 2021-11-22 ENCOUNTER — Other Ambulatory Visit: Payer: Self-pay

## 2021-12-01 DIAGNOSIS — G4733 Obstructive sleep apnea (adult) (pediatric): Secondary | ICD-10-CM | POA: Diagnosis not present

## 2021-12-04 ENCOUNTER — Other Ambulatory Visit: Payer: Self-pay

## 2021-12-04 DIAGNOSIS — I2694 Multiple subsegmental pulmonary emboli without acute cor pulmonale: Secondary | ICD-10-CM | POA: Diagnosis not present

## 2021-12-04 MED ORDER — LISINOPRIL 2.5 MG PO TABS
ORAL_TABLET | ORAL | 3 refills | Status: DC
  Filled 2021-12-04: qty 90, 90d supply, fill #0
  Filled 2022-02-11: qty 90, 90d supply, fill #1
  Filled 2022-05-23: qty 90, 90d supply, fill #2
  Filled 2022-08-16: qty 90, 90d supply, fill #3

## 2021-12-04 MED ORDER — ELIQUIS 5 MG PO TABS
ORAL_TABLET | ORAL | 3 refills | Status: DC
  Filled 2021-12-04: qty 180, 90d supply, fill #0
  Filled 2022-02-23: qty 180, 90d supply, fill #1
  Filled 2022-05-23: qty 180, 90d supply, fill #2
  Filled 2022-09-02: qty 180, 90d supply, fill #3

## 2021-12-04 MED ORDER — METOPROLOL TARTRATE 25 MG PO TABS
25.0000 mg | ORAL_TABLET | Freq: Two times a day (BID) | ORAL | 3 refills | Status: DC
  Filled 2021-12-04: qty 180, 90d supply, fill #0
  Filled 2022-02-23: qty 180, 90d supply, fill #1
  Filled 2022-05-23: qty 180, 90d supply, fill #2
  Filled 2022-09-02: qty 180, 90d supply, fill #3

## 2021-12-06 DIAGNOSIS — H40003 Preglaucoma, unspecified, bilateral: Secondary | ICD-10-CM | POA: Diagnosis not present

## 2021-12-13 ENCOUNTER — Other Ambulatory Visit: Payer: Self-pay

## 2021-12-25 DIAGNOSIS — E119 Type 2 diabetes mellitus without complications: Secondary | ICD-10-CM | POA: Diagnosis not present

## 2021-12-25 DIAGNOSIS — I2694 Multiple subsegmental pulmonary emboli without acute cor pulmonale: Secondary | ICD-10-CM | POA: Diagnosis not present

## 2021-12-25 DIAGNOSIS — E782 Mixed hyperlipidemia: Secondary | ICD-10-CM | POA: Diagnosis not present

## 2021-12-25 DIAGNOSIS — I10 Essential (primary) hypertension: Secondary | ICD-10-CM | POA: Diagnosis not present

## 2022-01-08 ENCOUNTER — Ambulatory Visit
Admission: RE | Admit: 2022-01-08 | Discharge: 2022-01-08 | Disposition: A | Discharge status: Home / Self Care | Payer: 59 | Source: Ambulatory Visit | Attending: Family Medicine | Admitting: Family Medicine

## 2022-01-08 DIAGNOSIS — Z1231 Encounter for screening mammogram for malignant neoplasm of breast: Secondary | ICD-10-CM | POA: Diagnosis not present

## 2022-01-11 DIAGNOSIS — E119 Type 2 diabetes mellitus without complications: Secondary | ICD-10-CM | POA: Diagnosis not present

## 2022-01-17 ENCOUNTER — Other Ambulatory Visit: Payer: Self-pay

## 2022-01-17 DIAGNOSIS — I10 Essential (primary) hypertension: Secondary | ICD-10-CM | POA: Diagnosis not present

## 2022-01-17 DIAGNOSIS — Z6841 Body Mass Index (BMI) 40.0 and over, adult: Secondary | ICD-10-CM | POA: Diagnosis not present

## 2022-01-17 DIAGNOSIS — E119 Type 2 diabetes mellitus without complications: Secondary | ICD-10-CM | POA: Diagnosis not present

## 2022-01-17 DIAGNOSIS — E785 Hyperlipidemia, unspecified: Secondary | ICD-10-CM | POA: Diagnosis not present

## 2022-01-17 DIAGNOSIS — I2699 Other pulmonary embolism without acute cor pulmonale: Secondary | ICD-10-CM | POA: Diagnosis not present

## 2022-01-17 MED ORDER — MOUNJARO 2.5 MG/0.5ML ~~LOC~~ SOAJ
SUBCUTANEOUS | 1 refills | Status: DC
  Filled 2022-01-17: qty 2, 28d supply, fill #0
  Filled 2022-02-11: qty 2, 28d supply, fill #1

## 2022-01-29 DIAGNOSIS — G4733 Obstructive sleep apnea (adult) (pediatric): Secondary | ICD-10-CM | POA: Diagnosis not present

## 2022-02-04 ENCOUNTER — Other Ambulatory Visit: Payer: Self-pay

## 2022-02-11 ENCOUNTER — Other Ambulatory Visit: Payer: Self-pay

## 2022-02-11 MED ORDER — METFORMIN HCL 1000 MG PO TABS
1000.0000 mg | ORAL_TABLET | Freq: Two times a day (BID) | ORAL | 3 refills | Status: DC
  Filled 2022-02-11: qty 180, 90d supply, fill #0
  Filled 2022-05-23: qty 180, 90d supply, fill #1
  Filled 2022-08-16: qty 180, 90d supply, fill #2
  Filled 2022-11-15: qty 180, 90d supply, fill #3

## 2022-02-13 ENCOUNTER — Other Ambulatory Visit: Payer: Self-pay

## 2022-02-15 ENCOUNTER — Other Ambulatory Visit: Payer: Self-pay

## 2022-02-18 ENCOUNTER — Other Ambulatory Visit: Payer: Self-pay

## 2022-02-19 DIAGNOSIS — R918 Other nonspecific abnormal finding of lung field: Secondary | ICD-10-CM | POA: Diagnosis not present

## 2022-02-19 DIAGNOSIS — G4733 Obstructive sleep apnea (adult) (pediatric): Secondary | ICD-10-CM | POA: Diagnosis not present

## 2022-02-19 DIAGNOSIS — I2601 Septic pulmonary embolism with acute cor pulmonale: Secondary | ICD-10-CM | POA: Diagnosis not present

## 2022-02-25 ENCOUNTER — Other Ambulatory Visit: Payer: Self-pay

## 2022-03-10 IMAGING — US US EXTREM LOW VENOUS
1 series · 13 of 24 positions shown · non-contrast
Comparison: None.

CLINICAL DATA: Lower extremity edema.  COVID positive.



[Series 1: us venous img lower bilat (dvt) · portal-venous · 13 of 59 slices shown]
[im 1/59]
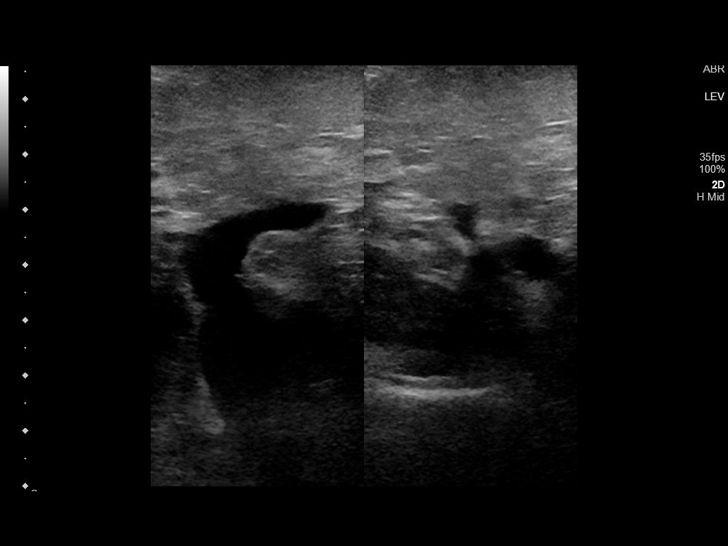
[im 6/59]
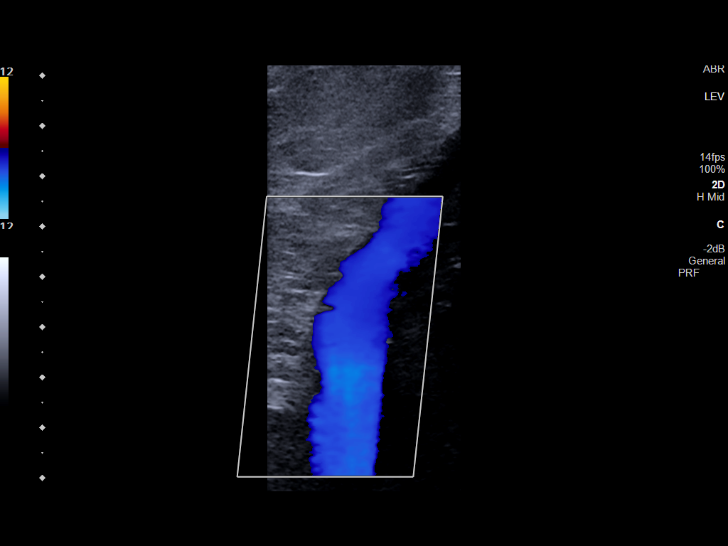
[im 11/59]
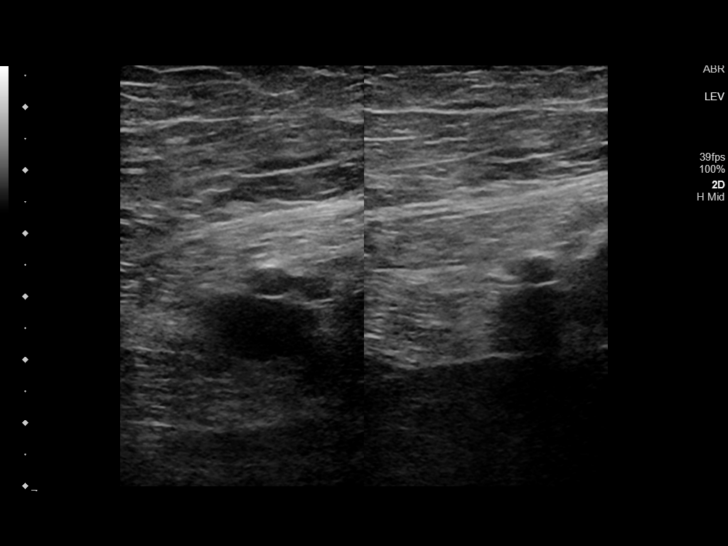
[im 16/59]
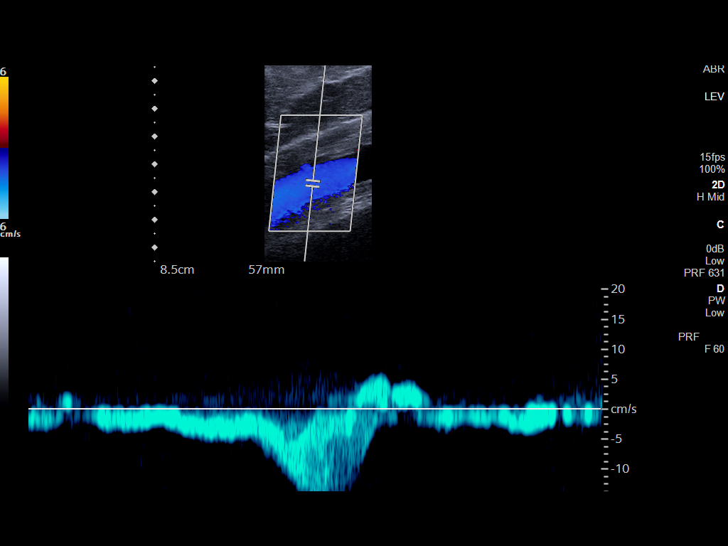
[im 21/59]
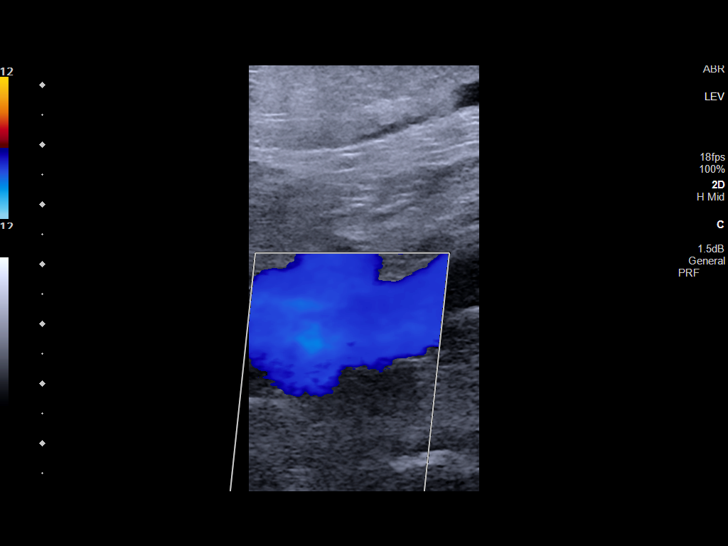
[im 26/59]
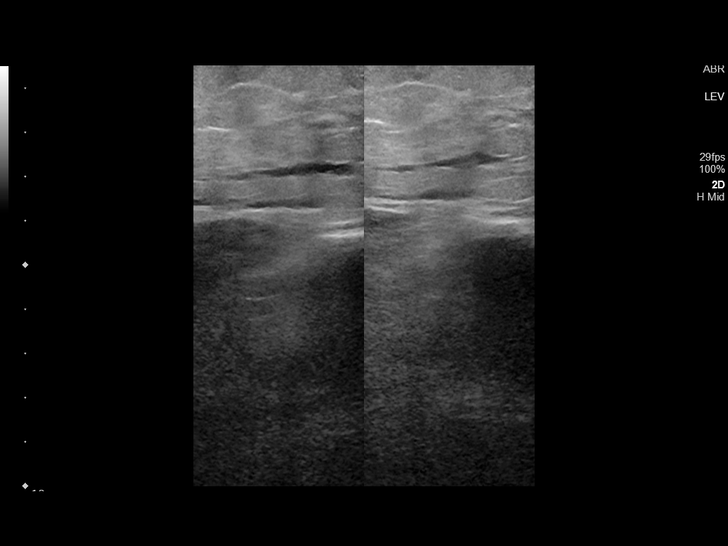
[im 31/59]
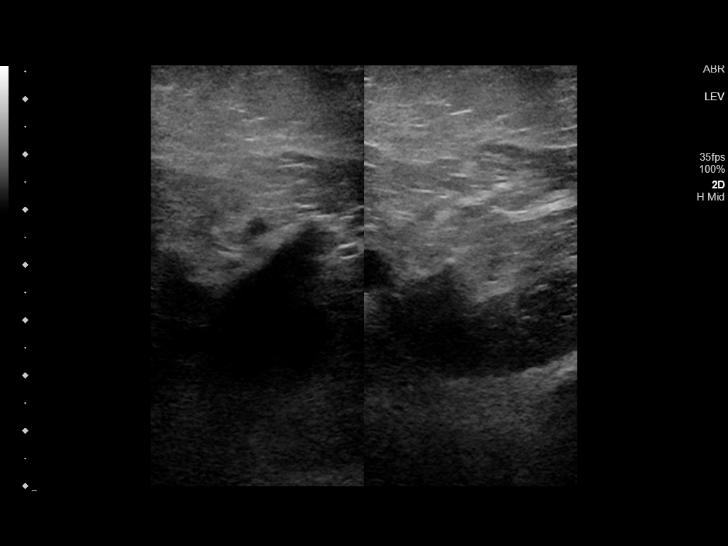
[im 33/59]
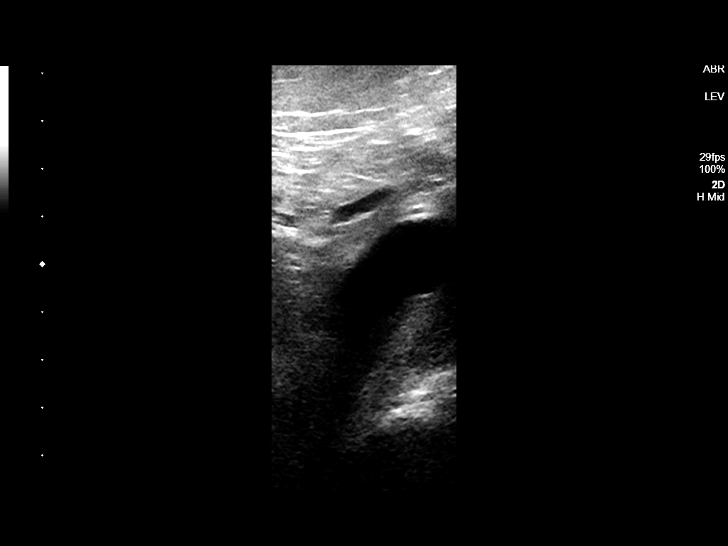
[im 38/59]
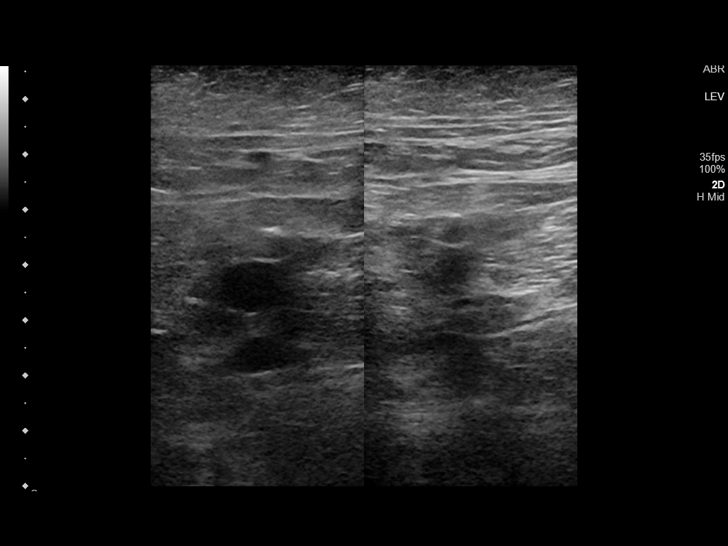
[im 43/59]
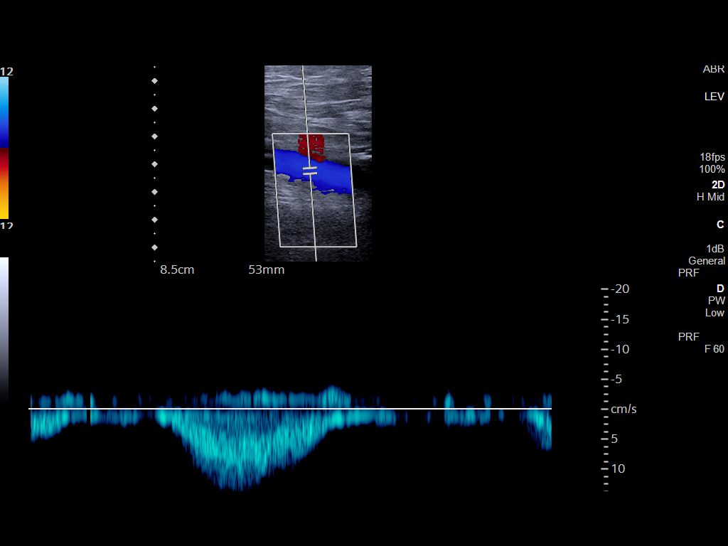
[im 48/59]
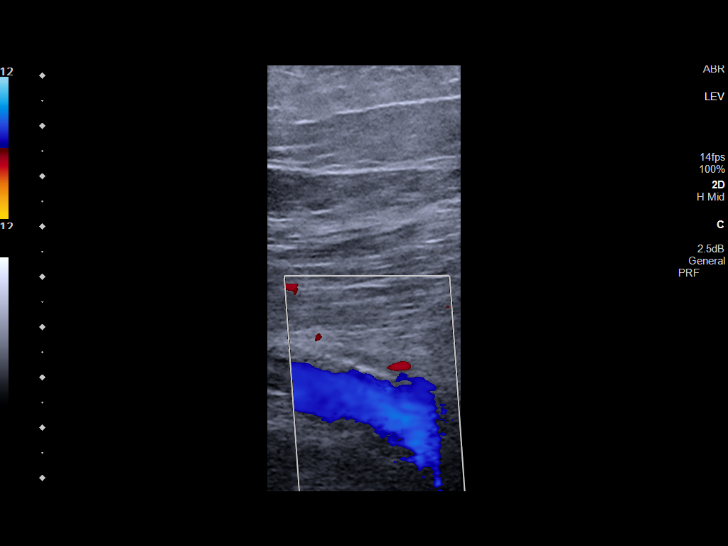
[im 53/59]
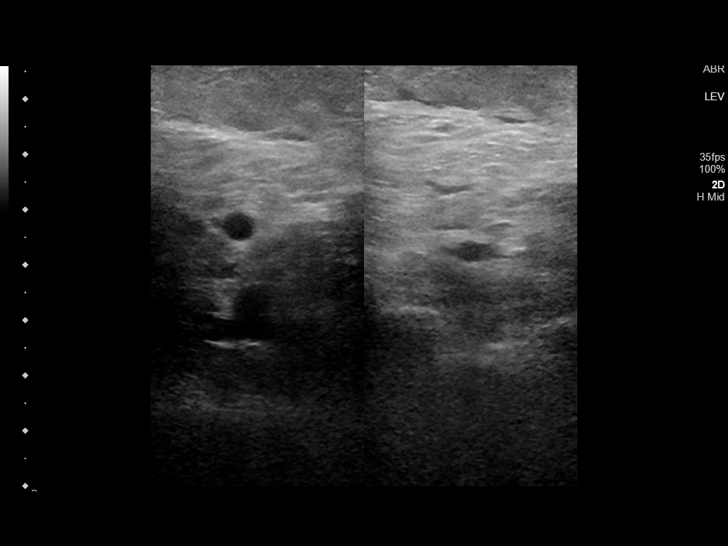
[im 59/59]
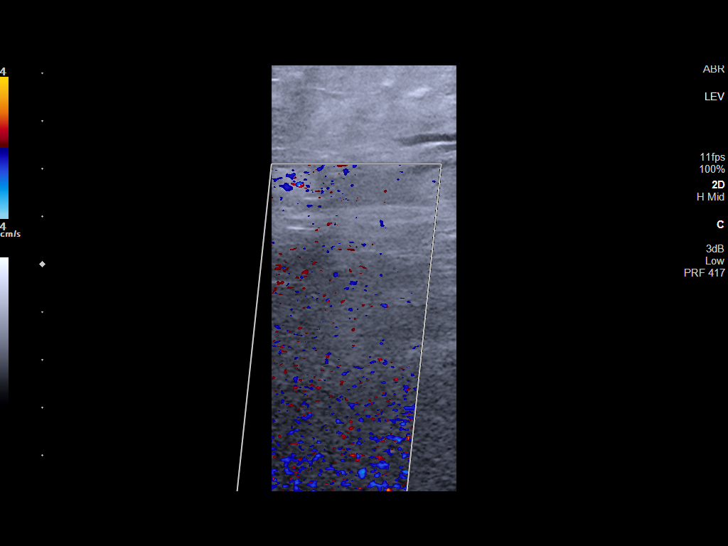

[13 of 24 positions shown; findings below may reference images not displayed]

FINDINGS: RIGHT LOWER EXTREMITY

Common Femoral Vein: No evidence of thrombus. Normal
compressibility, respiratory phasicity and response to augmentation.

Saphenofemoral Junction: No evidence of thrombus. Normal
compressibility and flow on color Doppler imaging.

Profunda Femoral Vein: No evidence of thrombus. Normal
compressibility and flow on color Doppler imaging.

Femoral Vein: No evidence of thrombus. Normal compressibility,
respiratory phasicity and response to augmentation.

Popliteal Vein: No evidence of thrombus. Normal compressibility,
respiratory phasicity and response to augmentation.

Calf Veins: No evidence of thrombus. Normal compressibility and flow
on color Doppler imaging.

Superficial Great Saphenous Vein: No evidence of thrombus. Normal
compressibility.

Venous Reflux:  None.

Other Findings:  None.

LEFT LOWER EXTREMITY

Common Femoral Vein: No evidence of thrombus. Normal
compressibility, respiratory phasicity and response to augmentation.

Saphenofemoral Junction: No evidence of thrombus. Normal
compressibility and flow on color Doppler imaging.

Profunda Femoral Vein: No evidence of thrombus. Normal
compressibility and flow on color Doppler imaging.

Femoral Vein: No evidence of thrombus. Normal compressibility,
respiratory phasicity and response to augmentation.

Popliteal Vein: No evidence of thrombus. Normal compressibility,
respiratory phasicity and response to augmentation.

Calf Veins: No evidence of thrombus. Normal compressibility and flow
on color Doppler imaging.

Superficial Great Saphenous Vein: No evidence of thrombus. Normal
compressibility.

Venous Reflux:  None.

Other Findings:  None.
IMPRESSION: No evidence of deep venous thrombosis in either lower extremity.

## 2022-03-18 ENCOUNTER — Other Ambulatory Visit: Payer: Self-pay

## 2022-03-18 MED ORDER — TRAZODONE HCL 50 MG PO TABS
50.0000 mg | ORAL_TABLET | Freq: Every day | ORAL | 3 refills | Status: DC
  Filled 2022-03-18: qty 90, 90d supply, fill #0
  Filled 2022-06-21: qty 90, 90d supply, fill #1
  Filled 2022-09-18: qty 90, 90d supply, fill #2
  Filled 2022-12-24: qty 90, 90d supply, fill #3

## 2022-03-18 MED ORDER — TIRZEPATIDE 2.5 MG/0.5ML ~~LOC~~ SOAJ
SUBCUTANEOUS | 1 refills | Status: DC
  Filled 2022-03-18: qty 2, 28d supply, fill #0
  Filled 2022-05-06: qty 2, 28d supply, fill #1

## 2022-04-22 ENCOUNTER — Other Ambulatory Visit: Payer: Self-pay

## 2022-04-22 ENCOUNTER — Ambulatory Visit
Admission: EM | Admit: 2022-04-22 | Discharge: 2022-04-22 | Disposition: A | Discharge status: Home / Self Care | Payer: Commercial Managed Care - PPO | Attending: Urgent Care | Admitting: Urgent Care

## 2022-04-22 DIAGNOSIS — B9789 Other viral agents as the cause of diseases classified elsewhere: Secondary | ICD-10-CM | POA: Diagnosis not present

## 2022-04-22 DIAGNOSIS — J019 Acute sinusitis, unspecified: Secondary | ICD-10-CM | POA: Diagnosis not present

## 2022-04-22 MED ORDER — PREDNISONE 20 MG PO TABS
ORAL_TABLET | ORAL | 0 refills | Status: AC
  Filled 2022-04-22: qty 12, 6d supply, fill #0

## 2022-04-22 NOTE — ED Triage Notes (Signed)
Patient presents to UC for sinus infection. Presents to [facility] for right ear pain, HA, facial pain since Friday. Taking coricidin and tylenol.

## 2022-04-22 NOTE — ED Provider Notes (Signed)
Roderic Palau    CSN: [id number] Arrival date & time: 04/22/22  0945      History   Chief Complaint Chief Complaint  Patient presents with   Headache   Facial Pain   Otalgia    HPI Carolyn Levine is a 62 y.o. female.    Headache Associated symptoms: ear pain   Otalgia Associated symptoms: headaches     Patient presents to urgent care with concern for sinus infection.  Symptoms starting Friday (3 days).   She reports right ear pain, headache, facial pain starting Friday.  She has been using Coricidin and Tylenol.  PMH including AKI, HFrEF, NSTEMI, DM2, Pulmonari emboli, ARF 2/2 C19.  She reports DM very well controlled with FBG this AM = 101. Most recent A1C = 6.6.  She states she has used steroids to treat sinus infections in the past without significant SE.  Past Medical History:  Diagnosis Date   Allergy    Cancer ([place])    basal cell skin ca   Diabetes mellitus without complication ([place])    Hyperlipidemia    Hypertension    Rosacea     Patient Active Problem List   Diagnosis Date Noted   Bacteremia due to Gram-negative bacteria 11/08/2021   UTI due to Klebsiella species 11/08/2021   NSTEMI (non-ST elevated myocardial infarction) ([place]) 11/03/2021   AKI (acute kidney injury) ([place]) 11/03/2021   Acute HFrEF (heart failure with reduced ejection fraction) ([place]) 11/03/2021   Pulmonary emboli ([place]) 11/02/2021   Atherosclerosis of abdominal aorta ([place]) 05/29/2021   Edema 04/29/2021   Constipation 04/29/2021   Morbid obesity with BMI of 50.0-59.9, adult ([place]) 04/28/2021   Pulmonary nodules 04/28/2021   Acute respiratory failure due to COVID-19 ([place]) 04/27/2021   Severe sepsis ([place]) 04/27/2021   Acute maxillary sinusitis, unspecified 03/09/2021   Hyperlipidemia, unspecified 06/04/2016   Hypertension 06/04/2016   Anemia 06/04/2016   Type 2 diabetes mellitus with hyperlipidemia ([place]) 03/08/2015    Past Surgical History:  Procedure Laterality Date    CHOLECYSTECTOMY  1994    OB History   No obstetric history on file.      Home Medications    Prior to Admission medications   Medication Sig Start Date End Date Taking? Authorizing Provider  apixaban (ELIQUIS) 5 MG TABS tablet Take 1 tablet (5 mg total) by mouth every 12 (twelve) hours 12/04/21     APIXABAN (ELIQUIS) VTE STARTER PACK (10MG AND 5MG) Take as directed on package: start with two-33m tablets twice daily for 7 days. On day 8, switch to one-519mtablet twice daily. 11/07/21   AyJennye BoroughsMD  aspirin 81 MG chewable tablet Chew 1 tablet (81 mg total) by mouth daily. 11/07/21   AyJennye BoroughsMD  benzonatate (TESSALON) 200 MG capsule Take 1 capsule (200 mg total) by mouth 3 (three) times daily as needed for cough. 11/07/21   AyJennye BoroughsMD  furosemide (LASIX) 20 MG tablet Take 1 tablet (20 mg total) by mouth daily for 7 doses. 11/08/21 11/15/21  AyJennye BoroughsMD  furosemide (LASIX) 20 MG tablet Take 1 tablet (20 mg total) by mouth once daily 11/16/21     guaiFENesin-dextromethorphan (ROBITUSSIN DM) 100-10 MG/5ML syrup Take 5 mLs by mouth every 4 (four) hours as needed for cough. 11/07/21   AyJennye BoroughsMD  lisinopril (ZESTRIL) 2.5 MG tablet Take 1 tablet (2.5 mg total) by mouth once daily 12/04/21     metFORMIN (GLUCOPHAGE) 1000 MG tablet Take 1 tablet (1,000  mg total) by mouth 2 (two) times daily 02/11/22     metoprolol tartrate (LOPRESSOR) 25 MG tablet Take 1 tablet (25 mg total) by mouth 2 (two) times daily 12/04/21     rosuvastatin (CRESTOR) 5 MG tablet Take 1 tablet (5 mg total) by mouth once daily 05/29/21     tirzepatide [facility]) 2.5 MG/0.5ML Pen Inject 2.5 mg subcutaneously every 7 (seven) days 03/18/22     traZODone (DESYREL) 50 MG tablet Take 1 tablet (50 mg total) by mouth at bedtime 03/18/22       Family History Family History  Problem Relation Age of Onset   Allergy (severe) Sister    Cancer Mother    Diabetes Father    Heart failure Father    Diabetes Sister     Breast cancer Neg Hx     Social History Social History   Tobacco Use   Smoking status: Never   Smokeless tobacco: Never  Substance Use Topics   Alcohol use: No    Alcohol/week: 0.0 standard drinks of alcohol   Drug use: No     Allergies   Codeine, Codeine sulfate, Simvastatin, and Sulphadimidine [sulfamethazine]   Review of Systems Review of Systems  HENT:  Positive for ear pain.   Neurological:  Positive for headaches.     Physical Exam Triage Vital Signs ED Triage Vitals  Enc Vitals Group     BP      Pulse      Resp      Temp      Temp src      SpO2      Weight      Height      Head Circumference      Peak Flow      Pain Score      Pain Loc      Pain Edu?      Excl. in [place]?    No data found.  Updated Vital Signs There were no vitals taken for this visit.  Visual Acuity Right Eye Distance:   Left Eye Distance:   Bilateral Distance:    Right Eye Near:   Left Eye Near:    Bilateral Near:     Physical Exam Vitals reviewed.  Constitutional:      Appearance: She is well-developed. She is obese.  HENT:     Right Ear: A middle ear effusion is present.     Left Ear: A middle ear effusion is present.  Cardiovascular:     Rate and Rhythm: Normal rate and regular rhythm.  Pulmonary:     Effort: Pulmonary effort is normal.     Breath sounds: Normal breath sounds.  Skin:    General: Skin is warm.  Neurological:     Mental Status: She is alert and oriented to person, place, and time.  Psychiatric:        Mood and Affect: Mood normal.        Behavior: Behavior normal.      UC Treatments / Results  Labs (all labs ordered are listed, but only abnormal results are displayed) Labs Reviewed - No data to display  EKG   Radiology No results found.  Procedures Procedures (including critical care time)  Medications Ordered in UC Medications - No data to display  Initial Impression / Assessment and Plan / UC Course  I have reviewed the  triage vital signs and the nursing notes.  Pertinent labs & imaging results that were available during my care  of the patient were reviewed by me and considered in my medical decision making (see chart for details).   Patient is afebrile here without recent antipyretics. Satting well on room air. Overall is well appearing, well hydrated, without respiratory distress. Pulmonary exam is unremarkable.  Lungs CTAB without wheezing, rhonchi, rales.  TMs are nonerythematous.  There is middle ear effusion.  Suspect viral sinusitis (URI) resulting in eustachian tube disorder.  Recommending continued use of OTC medication for symptom control including Nettie pot and sinus irrigation, Flonase.  We discussed prescribing anti-inflammatory prednisone given her very well-controlled blood sugar and she agreed to this plan.  Final Clinical Impressions(s) / UC Diagnoses   Final diagnoses:  None   Discharge Instructions   None    ED Prescriptions   None    PDMP not reviewed this encounter.   Rose Phi, [place] 04/22/22 1012

## 2022-04-22 NOTE — Discharge Instructions (Addendum)
Follow up here or with your primary care provider if your symptoms are worsening or not improving.     

## 2022-05-06 ENCOUNTER — Other Ambulatory Visit: Payer: Self-pay

## 2022-05-24 ENCOUNTER — Other Ambulatory Visit: Payer: Self-pay

## 2022-05-26 ENCOUNTER — Other Ambulatory Visit: Payer: Self-pay

## 2022-05-31 DIAGNOSIS — E119 Type 2 diabetes mellitus without complications: Secondary | ICD-10-CM | POA: Diagnosis not present

## 2022-05-31 DIAGNOSIS — E785 Hyperlipidemia, unspecified: Secondary | ICD-10-CM | POA: Diagnosis not present

## 2022-06-04 DIAGNOSIS — H40003 Preglaucoma, unspecified, bilateral: Secondary | ICD-10-CM | POA: Diagnosis not present

## 2022-06-05 ENCOUNTER — Other Ambulatory Visit: Payer: Self-pay

## 2022-06-05 DIAGNOSIS — E782 Mixed hyperlipidemia: Secondary | ICD-10-CM | POA: Diagnosis not present

## 2022-06-05 DIAGNOSIS — Z6841 Body Mass Index (BMI) 40.0 and over, adult: Secondary | ICD-10-CM | POA: Diagnosis not present

## 2022-06-05 DIAGNOSIS — I10 Essential (primary) hypertension: Secondary | ICD-10-CM | POA: Diagnosis not present

## 2022-06-05 DIAGNOSIS — E119 Type 2 diabetes mellitus without complications: Secondary | ICD-10-CM | POA: Diagnosis not present

## 2022-06-05 MED ORDER — MOUNJARO 5 MG/0.5ML ~~LOC~~ SOAJ
5.0000 mg | SUBCUTANEOUS | 1 refills | Status: DC
  Filled 2022-06-05: qty 2, 28d supply, fill #0
  Filled 2022-09-02: qty 2, 28d supply, fill #1
  Filled 2022-10-20: qty 2, 28d supply, fill #2
  Filled 2022-11-15 – 2023-01-29 (×2): qty 2, 28d supply, fill #3
  Filled 2023-02-27: qty 2, 28d supply, fill #4
  Filled 2023-03-31: qty 2, 28d supply, fill #5

## 2022-06-06 DIAGNOSIS — H2513 Age-related nuclear cataract, bilateral: Secondary | ICD-10-CM | POA: Diagnosis not present

## 2022-06-06 DIAGNOSIS — H18452 Nodular corneal degeneration, left eye: Secondary | ICD-10-CM | POA: Diagnosis not present

## 2022-06-06 DIAGNOSIS — H40003 Preglaucoma, unspecified, bilateral: Secondary | ICD-10-CM | POA: Diagnosis not present

## 2022-06-06 DIAGNOSIS — E119 Type 2 diabetes mellitus without complications: Secondary | ICD-10-CM | POA: Diagnosis not present

## 2022-06-21 ENCOUNTER — Other Ambulatory Visit: Payer: Self-pay

## 2022-07-01 DIAGNOSIS — G4733 Obstructive sleep apnea (adult) (pediatric): Secondary | ICD-10-CM | POA: Diagnosis not present

## 2022-07-31 DIAGNOSIS — G4733 Obstructive sleep apnea (adult) (pediatric): Secondary | ICD-10-CM | POA: Diagnosis not present

## 2022-08-31 DIAGNOSIS — G4733 Obstructive sleep apnea (adult) (pediatric): Secondary | ICD-10-CM | POA: Diagnosis not present

## 2022-09-02 ENCOUNTER — Other Ambulatory Visit: Payer: Self-pay

## 2022-09-06 DIAGNOSIS — G4733 Obstructive sleep apnea (adult) (pediatric): Secondary | ICD-10-CM | POA: Diagnosis not present

## 2022-10-16 DIAGNOSIS — R8290 Unspecified abnormal findings in urine: Secondary | ICD-10-CM | POA: Diagnosis not present

## 2022-10-16 DIAGNOSIS — E119 Type 2 diabetes mellitus without complications: Secondary | ICD-10-CM | POA: Diagnosis not present

## 2022-10-18 ENCOUNTER — Other Ambulatory Visit: Payer: Self-pay

## 2022-10-18 MED ORDER — CIPROFLOXACIN HCL 250 MG PO TABS
250.0000 mg | ORAL_TABLET | Freq: Two times a day (BID) | ORAL | 0 refills | Status: DC
  Filled 2022-10-18: qty 14, 7d supply, fill #0

## 2022-10-21 ENCOUNTER — Other Ambulatory Visit: Payer: Self-pay

## 2022-10-21 DIAGNOSIS — R197 Diarrhea, unspecified: Secondary | ICD-10-CM | POA: Diagnosis not present

## 2022-10-21 DIAGNOSIS — E119 Type 2 diabetes mellitus without complications: Secondary | ICD-10-CM | POA: Diagnosis not present

## 2022-10-21 DIAGNOSIS — E782 Mixed hyperlipidemia: Secondary | ICD-10-CM | POA: Diagnosis not present

## 2022-10-21 DIAGNOSIS — Z0000 Encounter for general adult medical examination without abnormal findings: Secondary | ICD-10-CM | POA: Diagnosis not present

## 2022-10-21 DIAGNOSIS — I2699 Other pulmonary embolism without acute cor pulmonale: Secondary | ICD-10-CM | POA: Diagnosis not present

## 2022-10-21 DIAGNOSIS — I10 Essential (primary) hypertension: Secondary | ICD-10-CM | POA: Diagnosis not present

## 2022-10-21 DIAGNOSIS — Z1211 Encounter for screening for malignant neoplasm of colon: Secondary | ICD-10-CM | POA: Diagnosis not present

## 2022-10-21 MED ORDER — ELIQUIS 5 MG PO TABS: 5.0000 mg | ORAL_TABLET | Freq: Two times a day (BID) | ORAL | 3 refills | Status: DC

## 2022-10-21 MED ORDER — LISINOPRIL 2.5 MG PO TABS
2.5000 mg | ORAL_TABLET | Freq: Every day | ORAL | 3 refills | Status: DC
  Filled 2022-11-11: qty 90, 90d supply, fill #0
  Filled 2023-02-13: qty 90, 90d supply, fill #1
  Filled 2023-05-15: qty 90, 90d supply, fill #2
  Filled 2023-08-20: qty 90, 90d supply, fill #3

## 2022-10-21 MED ORDER — FUROSEMIDE 20 MG PO TABS
20.0000 mg | ORAL_TABLET | Freq: Every day | ORAL | 4 refills | Status: AC
  Filled 2023-01-29: qty 90, 90d supply, fill #0
  Filled 2023-08-06: qty 90, 90d supply, fill #1

## 2022-10-21 MED ORDER — ROSUVASTATIN CALCIUM 5 MG PO TABS
5.0000 mg | ORAL_TABLET | ORAL | 3 refills | Status: AC
  Filled 2022-12-24: qty 30, 90d supply, fill #0
  Filled 2023-06-24: qty 30, 90d supply, fill #1

## 2022-10-21 MED ORDER — METOPROLOL TARTRATE 25 MG PO TABS
25.0000 mg | ORAL_TABLET | Freq: Two times a day (BID) | ORAL | 3 refills | Status: DC
  Filled 2022-11-25: qty 180, 90d supply, fill #0
  Filled 2023-02-27: qty 180, 90d supply, fill #1
  Filled 2023-05-29: qty 180, 90d supply, fill #2
  Filled 2023-08-20: qty 180, 90d supply, fill #3

## 2022-10-24 ENCOUNTER — Other Ambulatory Visit: Payer: Self-pay

## 2022-10-28 ENCOUNTER — Other Ambulatory Visit: Payer: Self-pay

## 2022-10-28 DIAGNOSIS — Z86711 Personal history of pulmonary embolism: Secondary | ICD-10-CM | POA: Diagnosis not present

## 2022-10-28 DIAGNOSIS — R0609 Other forms of dyspnea: Secondary | ICD-10-CM | POA: Diagnosis not present

## 2022-10-28 DIAGNOSIS — G4733 Obstructive sleep apnea (adult) (pediatric): Secondary | ICD-10-CM | POA: Diagnosis not present

## 2022-10-28 DIAGNOSIS — R918 Other nonspecific abnormal finding of lung field: Secondary | ICD-10-CM | POA: Diagnosis not present

## 2022-10-28 MED ORDER — ELIQUIS 2.5 MG PO TABS
2.5000 mg | ORAL_TABLET | Freq: Two times a day (BID) | ORAL | 3 refills | Status: DC
  Filled 2022-10-28: qty 60, 30d supply, fill #0
  Filled 2023-01-09: qty 60, 30d supply, fill #1
  Filled 2023-02-07: qty 60, 30d supply, fill #2
  Filled 2023-03-06: qty 60, 30d supply, fill #3

## 2022-10-29 ENCOUNTER — Other Ambulatory Visit: Payer: Self-pay | Admitting: Specialist

## 2022-10-29 DIAGNOSIS — R918 Other nonspecific abnormal finding of lung field: Secondary | ICD-10-CM

## 2022-11-04 ENCOUNTER — Ambulatory Visit
Admission: RE | Admit: 2022-11-04 | Discharge: 2022-11-04 | Disposition: A | Discharge status: Home / Self Care | Payer: Commercial Managed Care - PPO | Source: Ambulatory Visit | Attending: Specialist | Admitting: Specialist

## 2022-11-04 DIAGNOSIS — E042 Nontoxic multinodular goiter: Secondary | ICD-10-CM | POA: Diagnosis not present

## 2022-11-04 DIAGNOSIS — R918 Other nonspecific abnormal finding of lung field: Secondary | ICD-10-CM | POA: Insufficient documentation

## 2022-11-04 DIAGNOSIS — J9811 Atelectasis: Secondary | ICD-10-CM | POA: Diagnosis not present

## 2022-11-11 ENCOUNTER — Other Ambulatory Visit: Payer: Self-pay

## 2022-11-18 ENCOUNTER — Other Ambulatory Visit: Payer: Self-pay | Admitting: Specialist

## 2022-11-18 ENCOUNTER — Ambulatory Visit
Admission: RE | Admit: 2022-11-18 | Discharge: 2022-11-18 | Disposition: A | Discharge status: Home / Self Care | Payer: Commercial Managed Care - PPO | Source: Ambulatory Visit | Attending: Specialist | Admitting: Specialist

## 2022-11-18 DIAGNOSIS — E042 Nontoxic multinodular goiter: Secondary | ICD-10-CM | POA: Diagnosis not present

## 2022-11-25 ENCOUNTER — Other Ambulatory Visit: Payer: Self-pay

## 2022-12-24 ENCOUNTER — Other Ambulatory Visit: Payer: Self-pay

## 2022-12-24 DIAGNOSIS — E119 Type 2 diabetes mellitus without complications: Secondary | ICD-10-CM | POA: Diagnosis not present

## 2022-12-24 DIAGNOSIS — E041 Nontoxic single thyroid nodule: Secondary | ICD-10-CM | POA: Diagnosis not present

## 2022-12-25 ENCOUNTER — Other Ambulatory Visit: Payer: Self-pay

## 2022-12-25 MED ORDER — MOUNJARO 2.5 MG/0.5ML ~~LOC~~ SOAJ
2.5000 mg | SUBCUTANEOUS | 0 refills | Status: DC
Start: 2022-12-25 — End: 2023-05-08
  Filled 2022-12-25 – 2022-12-27 (×2): qty 2, 28d supply, fill #0

## 2022-12-27 ENCOUNTER — Other Ambulatory Visit: Payer: Self-pay

## 2023-01-06 DIAGNOSIS — H18523 Epithelial (juvenile) corneal dystrophy, bilateral: Secondary | ICD-10-CM | POA: Diagnosis not present

## 2023-01-06 DIAGNOSIS — H2513 Age-related nuclear cataract, bilateral: Secondary | ICD-10-CM | POA: Diagnosis not present

## 2023-01-06 DIAGNOSIS — H40003 Preglaucoma, unspecified, bilateral: Secondary | ICD-10-CM | POA: Diagnosis not present

## 2023-01-09 ENCOUNTER — Other Ambulatory Visit: Payer: Self-pay

## 2023-01-10 ENCOUNTER — Other Ambulatory Visit: Payer: Self-pay

## 2023-01-10 MED ORDER — METFORMIN HCL 1000 MG PO TABS
1000.0000 mg | ORAL_TABLET | Freq: Two times a day (BID) | ORAL | 3 refills | Status: DC
  Filled 2023-01-10 – 2023-02-13 (×2): qty 180, 90d supply, fill #0
  Filled 2023-06-24: qty 180, 90d supply, fill #1

## 2023-01-24 DIAGNOSIS — G4733 Obstructive sleep apnea (adult) (pediatric): Secondary | ICD-10-CM | POA: Diagnosis not present

## 2023-01-29 ENCOUNTER — Other Ambulatory Visit: Payer: Self-pay

## 2023-01-29 DIAGNOSIS — E119 Type 2 diabetes mellitus without complications: Secondary | ICD-10-CM | POA: Diagnosis not present

## 2023-02-05 DIAGNOSIS — Z6841 Body Mass Index (BMI) 40.0 and over, adult: Secondary | ICD-10-CM | POA: Diagnosis not present

## 2023-02-05 DIAGNOSIS — I10 Essential (primary) hypertension: Secondary | ICD-10-CM | POA: Diagnosis not present

## 2023-02-05 DIAGNOSIS — E041 Nontoxic single thyroid nodule: Secondary | ICD-10-CM | POA: Diagnosis not present

## 2023-02-05 DIAGNOSIS — E119 Type 2 diabetes mellitus without complications: Secondary | ICD-10-CM | POA: Diagnosis not present

## 2023-02-13 ENCOUNTER — Other Ambulatory Visit: Payer: Self-pay

## 2023-02-23 DIAGNOSIS — G4733 Obstructive sleep apnea (adult) (pediatric): Secondary | ICD-10-CM | POA: Diagnosis not present

## 2023-02-28 DIAGNOSIS — G4733 Obstructive sleep apnea (adult) (pediatric): Secondary | ICD-10-CM | POA: Diagnosis not present

## 2023-03-26 DIAGNOSIS — G4733 Obstructive sleep apnea (adult) (pediatric): Secondary | ICD-10-CM | POA: Diagnosis not present

## 2023-03-31 ENCOUNTER — Other Ambulatory Visit: Payer: Self-pay

## 2023-04-01 ENCOUNTER — Other Ambulatory Visit: Payer: Self-pay

## 2023-04-01 MED ORDER — TRAZODONE HCL 50 MG PO TABS
50.0000 mg | ORAL_TABLET | Freq: Every day | ORAL | 3 refills | Status: DC
  Filled 2023-04-01: qty 90, 90d supply, fill #0
  Filled 2023-07-12: qty 90, 90d supply, fill #1
  Filled 2023-10-19: qty 90, 90d supply, fill #2
  Filled 2024-01-13: qty 90, 90d supply, fill #3

## 2023-04-02 ENCOUNTER — Other Ambulatory Visit: Payer: Self-pay

## 2023-04-02 MED ORDER — APIXABAN 2.5 MG PO TABS
2.5000 mg | ORAL_TABLET | Freq: Two times a day (BID) | ORAL | 3 refills | Status: DC
  Filled 2023-04-02: qty 60, 30d supply, fill #0
  Filled 2023-05-08: qty 60, 30d supply, fill #1
  Filled 2023-05-29 – 2023-06-05 (×2): qty 60, 30d supply, fill #2
  Filled 2023-07-12: qty 60, 30d supply, fill #3
  Filled ????-??-??: fill #2

## 2023-04-11 DIAGNOSIS — G4733 Obstructive sleep apnea (adult) (pediatric): Secondary | ICD-10-CM | POA: Diagnosis not present

## 2023-04-16 ENCOUNTER — Other Ambulatory Visit: Payer: Self-pay

## 2023-04-16 MED ORDER — PREDNISONE 20 MG PO TABS
40.0000 mg | ORAL_TABLET | Freq: Every day | ORAL | 0 refills | Status: AC
  Filled 2023-04-16: qty 10, 5d supply, fill #0

## 2023-04-16 MED ORDER — AZELASTINE HCL 0.1 % NA SOLN
1.0000 | Freq: Two times a day (BID) | NASAL | 0 refills | Status: AC
  Filled 2023-04-16: qty 30, 30d supply, fill #0

## 2023-04-16 MED ORDER — DM-GUAIFENESIN ER 60-1200 MG PO TB12
1.0000 | ORAL_TABLET | Freq: Two times a day (BID) | ORAL | 0 refills | Status: AC
  Filled 2023-04-16: qty 20, 10d supply, fill #0

## 2023-04-16 MED ORDER — DOXYCYCLINE MONOHYDRATE 100 MG PO CAPS
100.0000 mg | ORAL_CAPSULE | Freq: Two times a day (BID) | ORAL | 0 refills | Status: AC
  Filled 2023-04-16: qty 10, 5d supply, fill #0

## 2023-04-16 MED ORDER — OSELTAMIVIR PHOSPHATE 75 MG PO CAPS
75.0000 mg | ORAL_CAPSULE | Freq: Two times a day (BID) | ORAL | 0 refills | Status: AC
  Filled 2023-04-16: qty 10, 5d supply, fill #0

## 2023-05-06 DIAGNOSIS — E119 Type 2 diabetes mellitus without complications: Secondary | ICD-10-CM | POA: Diagnosis not present

## 2023-05-07 ENCOUNTER — Other Ambulatory Visit: Payer: Self-pay | Admitting: Family Medicine

## 2023-05-07 DIAGNOSIS — Z1231 Encounter for screening mammogram for malignant neoplasm of breast: Secondary | ICD-10-CM

## 2023-05-07 DIAGNOSIS — G4733 Obstructive sleep apnea (adult) (pediatric): Secondary | ICD-10-CM | POA: Diagnosis not present

## 2023-05-07 DIAGNOSIS — R0609 Other forms of dyspnea: Secondary | ICD-10-CM | POA: Diagnosis not present

## 2023-05-07 DIAGNOSIS — E6609 Other obesity due to excess calories: Secondary | ICD-10-CM | POA: Diagnosis not present

## 2023-05-07 DIAGNOSIS — R918 Other nonspecific abnormal finding of lung field: Secondary | ICD-10-CM | POA: Diagnosis not present

## 2023-05-08 ENCOUNTER — Other Ambulatory Visit: Payer: Self-pay

## 2023-05-08 DIAGNOSIS — Z6841 Body Mass Index (BMI) 40.0 and over, adult: Secondary | ICD-10-CM | POA: Diagnosis not present

## 2023-05-08 DIAGNOSIS — Z7901 Long term (current) use of anticoagulants: Secondary | ICD-10-CM | POA: Diagnosis not present

## 2023-05-08 DIAGNOSIS — E119 Type 2 diabetes mellitus without complications: Secondary | ICD-10-CM | POA: Diagnosis not present

## 2023-05-08 DIAGNOSIS — I10 Essential (primary) hypertension: Secondary | ICD-10-CM | POA: Diagnosis not present

## 2023-05-08 MED ORDER — FUROSEMIDE 20 MG PO TABS
20.0000 mg | ORAL_TABLET | Freq: Every day | ORAL | 4 refills | Status: AC
  Filled 2023-05-08: qty 90, 90d supply, fill #0
  Filled 2023-11-02: qty 90, 90d supply, fill #1
  Filled 2024-02-07: qty 90, 90d supply, fill #2

## 2023-05-08 MED ORDER — MOUNJARO 7.5 MG/0.5ML ~~LOC~~ SOAJ
7.5000 mg | SUBCUTANEOUS | 5 refills | Status: AC
  Filled 2023-05-08: qty 2, 28d supply, fill #0
  Filled 2023-06-24: qty 2, 28d supply, fill #1
  Filled 2023-08-06: qty 2, 28d supply, fill #2
  Filled 2023-09-08: qty 2, 28d supply, fill #3
  Filled 2023-10-06: qty 2, 28d supply, fill #4
  Filled 2023-11-02: qty 2, 28d supply, fill #5
  Filled 2023-11-29: qty 2, 28d supply, fill #6
  Filled 2024-01-02: qty 2, 28d supply, fill #7
  Filled 2024-01-31 – 2024-02-12 (×2): qty 2, 28d supply, fill #8
  Filled 2024-03-31: qty 2, 28d supply, fill #9

## 2023-05-22 ENCOUNTER — Ambulatory Visit
Admission: RE | Admit: 2023-05-22 | Discharge: 2023-05-22 | Disposition: A | Discharge status: Home / Self Care | Payer: Commercial Managed Care - PPO | Source: Ambulatory Visit | Attending: Family Medicine | Admitting: Family Medicine

## 2023-05-22 DIAGNOSIS — Z1231 Encounter for screening mammogram for malignant neoplasm of breast: Secondary | ICD-10-CM | POA: Diagnosis not present

## 2023-05-29 ENCOUNTER — Other Ambulatory Visit: Payer: Self-pay

## 2023-05-29 MED ORDER — ASPIRIN 81 MG PO CHEW
81.0000 mg | CHEWABLE_TABLET | Freq: Every day | ORAL | 1 refills | Status: DC
  Filled 2023-05-29: qty 90, 90d supply, fill #0
  Filled 2023-09-08: qty 90, 90d supply, fill #1

## 2023-06-05 ENCOUNTER — Other Ambulatory Visit: Payer: Self-pay

## 2023-06-24 ENCOUNTER — Other Ambulatory Visit: Payer: Self-pay

## 2023-07-04 ENCOUNTER — Other Ambulatory Visit: Payer: Self-pay

## 2023-07-07 DIAGNOSIS — H40003 Preglaucoma, unspecified, bilateral: Secondary | ICD-10-CM | POA: Diagnosis not present

## 2023-07-23 DIAGNOSIS — G4733 Obstructive sleep apnea (adult) (pediatric): Secondary | ICD-10-CM | POA: Diagnosis not present

## 2023-08-06 ENCOUNTER — Other Ambulatory Visit: Payer: Self-pay

## 2023-08-06 MED ORDER — APIXABAN 2.5 MG PO TABS
2.5000 mg | ORAL_TABLET | Freq: Two times a day (BID) | ORAL | 3 refills | Status: DC
  Filled 2023-08-06: qty 60, 30d supply, fill #0
  Filled 2023-09-08: qty 60, 30d supply, fill #1
  Filled 2023-10-06: qty 60, 30d supply, fill #2
  Filled 2023-11-02: qty 60, 30d supply, fill #3

## 2023-10-30 DIAGNOSIS — Z7901 Long term (current) use of anticoagulants: Secondary | ICD-10-CM | POA: Diagnosis not present

## 2023-10-30 DIAGNOSIS — G4733 Obstructive sleep apnea (adult) (pediatric): Secondary | ICD-10-CM | POA: Diagnosis not present

## 2023-10-30 DIAGNOSIS — R918 Other nonspecific abnormal finding of lung field: Secondary | ICD-10-CM | POA: Diagnosis not present

## 2023-10-30 DIAGNOSIS — R0609 Other forms of dyspnea: Secondary | ICD-10-CM | POA: Diagnosis not present

## 2023-11-18 ENCOUNTER — Other Ambulatory Visit: Payer: Self-pay

## 2023-11-18 MED ORDER — LISINOPRIL 2.5 MG PO TABS
2.5000 mg | ORAL_TABLET | Freq: Every day | ORAL | 3 refills | Status: AC
  Filled 2023-11-18: qty 90, 90d supply, fill #0
  Filled 2024-02-14: qty 90, 90d supply, fill #1
  Filled 2024-02-28: qty 90, 90d supply, fill #2

## 2023-11-23 ENCOUNTER — Other Ambulatory Visit: Payer: Self-pay

## 2023-11-24 ENCOUNTER — Other Ambulatory Visit: Payer: Self-pay

## 2023-11-24 MED ORDER — METOPROLOL TARTRATE 25 MG PO TABS
25.0000 mg | ORAL_TABLET | Freq: Two times a day (BID) | ORAL | 3 refills | Status: AC
  Filled 2023-11-24: qty 180, 90d supply, fill #0
  Filled 2024-02-28: qty 180, 90d supply, fill #1

## 2023-11-29 ENCOUNTER — Other Ambulatory Visit: Payer: Self-pay

## 2023-12-01 ENCOUNTER — Other Ambulatory Visit: Payer: Self-pay

## 2023-12-01 MED ORDER — APIXABAN 2.5 MG PO TABS
2.5000 mg | ORAL_TABLET | Freq: Two times a day (BID) | ORAL | 3 refills | Status: AC
  Filled 2023-12-01: qty 60, 30d supply, fill #0
  Filled 2024-01-02: qty 60, 30d supply, fill #1
  Filled 2024-01-31: qty 60, 30d supply, fill #2
  Filled 2024-02-28: qty 60, 30d supply, fill #3

## 2023-12-01 MED ORDER — ASPIRIN 81 MG PO CHEW
81.0000 mg | CHEWABLE_TABLET | Freq: Every day | ORAL | 1 refills | Status: DC
  Filled 2023-12-01: qty 90, 90d supply, fill #0
  Filled 2024-02-28: qty 90, 90d supply, fill #1

## 2023-12-18 DIAGNOSIS — E041 Nontoxic single thyroid nodule: Secondary | ICD-10-CM | POA: Diagnosis not present

## 2023-12-23 DIAGNOSIS — H18453 Nodular corneal degeneration, bilateral: Secondary | ICD-10-CM | POA: Diagnosis not present

## 2023-12-23 DIAGNOSIS — E119 Type 2 diabetes mellitus without complications: Secondary | ICD-10-CM | POA: Diagnosis not present

## 2023-12-23 DIAGNOSIS — H40003 Preglaucoma, unspecified, bilateral: Secondary | ICD-10-CM | POA: Diagnosis not present

## 2023-12-23 DIAGNOSIS — Z0000 Encounter for general adult medical examination without abnormal findings: Secondary | ICD-10-CM | POA: Diagnosis not present

## 2023-12-23 DIAGNOSIS — H2513 Age-related nuclear cataract, bilateral: Secondary | ICD-10-CM | POA: Diagnosis not present

## 2023-12-25 DIAGNOSIS — E042 Nontoxic multinodular goiter: Secondary | ICD-10-CM | POA: Diagnosis not present

## 2024-01-06 DIAGNOSIS — I10 Essential (primary) hypertension: Secondary | ICD-10-CM | POA: Diagnosis not present

## 2024-01-06 DIAGNOSIS — M79672 Pain in left foot: Secondary | ICD-10-CM | POA: Diagnosis not present

## 2024-01-06 DIAGNOSIS — D649 Anemia, unspecified: Secondary | ICD-10-CM | POA: Diagnosis not present

## 2024-01-06 DIAGNOSIS — Z6841 Body Mass Index (BMI) 40.0 and over, adult: Secondary | ICD-10-CM | POA: Diagnosis not present

## 2024-01-06 DIAGNOSIS — E119 Type 2 diabetes mellitus without complications: Secondary | ICD-10-CM | POA: Diagnosis not present

## 2024-01-06 DIAGNOSIS — R8290 Unspecified abnormal findings in urine: Secondary | ICD-10-CM | POA: Diagnosis not present

## 2024-01-09 ENCOUNTER — Other Ambulatory Visit: Payer: Self-pay

## 2024-01-09 MED ORDER — CIPROFLOXACIN HCL 250 MG PO TABS
250.0000 mg | ORAL_TABLET | Freq: Two times a day (BID) | ORAL | 0 refills | Status: AC
  Filled 2024-01-09: qty 14, 7d supply, fill #0

## 2024-01-13 ENCOUNTER — Other Ambulatory Visit: Payer: Self-pay

## 2024-01-13 DIAGNOSIS — M205X1 Other deformities of toe(s) (acquired), right foot: Secondary | ICD-10-CM | POA: Diagnosis not present

## 2024-01-13 DIAGNOSIS — M79672 Pain in left foot: Secondary | ICD-10-CM | POA: Diagnosis not present

## 2024-01-13 DIAGNOSIS — E042 Nontoxic multinodular goiter: Secondary | ICD-10-CM | POA: Diagnosis not present

## 2024-01-13 DIAGNOSIS — E1140 Type 2 diabetes mellitus with diabetic neuropathy, unspecified: Secondary | ICD-10-CM | POA: Diagnosis not present

## 2024-01-13 DIAGNOSIS — M205X2 Other deformities of toe(s) (acquired), left foot: Secondary | ICD-10-CM | POA: Diagnosis not present

## 2024-01-13 DIAGNOSIS — B353 Tinea pedis: Secondary | ICD-10-CM | POA: Diagnosis not present

## 2024-01-13 DIAGNOSIS — X503XXA Overexertion from repetitive movements, initial encounter: Secondary | ICD-10-CM | POA: Diagnosis not present

## 2024-01-13 DIAGNOSIS — L84 Corns and callosities: Secondary | ICD-10-CM | POA: Diagnosis not present

## 2024-01-13 MED ORDER — METFORMIN HCL 1000 MG PO TABS
1000.0000 mg | ORAL_TABLET | Freq: Two times a day (BID) | ORAL | 3 refills | Status: AC
  Filled 2024-01-13: qty 180, 90d supply, fill #0
  Filled 2024-04-07: qty 180, 90d supply, fill #1

## 2024-01-13 MED ORDER — CLOTRIMAZOLE-BETAMETHASONE 1-0.05 % EX CREA
TOPICAL_CREAM | Freq: Two times a day (BID) | CUTANEOUS | 3 refills | Status: AC
  Filled 2024-01-13: qty 30, 15d supply, fill #0
  Filled 2024-02-14: qty 30, 15d supply, fill #1
  Filled 2024-03-31: qty 30, 15d supply, fill #2

## 2024-01-19 DIAGNOSIS — E042 Nontoxic multinodular goiter: Secondary | ICD-10-CM | POA: Diagnosis not present

## 2024-01-28 ENCOUNTER — Other Ambulatory Visit: Payer: Self-pay

## 2024-02-01 ENCOUNTER — Other Ambulatory Visit: Payer: Self-pay

## 2024-02-01 MED ORDER — PAXLOVID (300/100) 20 X 150 MG & 10 X 100MG PO TBPK
ORAL_TABLET | ORAL | 0 refills | Status: AC
  Filled 2024-02-01: qty 30, 5d supply, fill #0

## 2024-02-04 ENCOUNTER — Other Ambulatory Visit: Payer: Self-pay

## 2024-02-09 ENCOUNTER — Other Ambulatory Visit: Payer: Self-pay

## 2024-02-12 ENCOUNTER — Other Ambulatory Visit: Payer: Self-pay

## 2024-02-19 DIAGNOSIS — E042 Nontoxic multinodular goiter: Secondary | ICD-10-CM | POA: Diagnosis not present

## 2024-02-23 ENCOUNTER — Other Ambulatory Visit: Payer: Self-pay

## 2024-02-23 DIAGNOSIS — I872 Venous insufficiency (chronic) (peripheral): Secondary | ICD-10-CM | POA: Diagnosis not present

## 2024-02-23 DIAGNOSIS — E1140 Type 2 diabetes mellitus with diabetic neuropathy, unspecified: Secondary | ICD-10-CM | POA: Diagnosis not present

## 2024-02-23 DIAGNOSIS — B353 Tinea pedis: Secondary | ICD-10-CM | POA: Diagnosis not present

## 2024-02-23 DIAGNOSIS — E042 Nontoxic multinodular goiter: Secondary | ICD-10-CM | POA: Diagnosis not present

## 2024-02-23 DIAGNOSIS — I739 Peripheral vascular disease, unspecified: Secondary | ICD-10-CM | POA: Diagnosis not present

## 2024-02-23 MED ORDER — CLOTRIMAZOLE-BETAMETHASONE 1-0.05 % EX CREA
1.0000 | TOPICAL_CREAM | Freq: Two times a day (BID) | CUTANEOUS | 3 refills | Status: AC
  Filled 2024-02-23 – 2024-02-26 (×2): qty 30, 15d supply, fill #0

## 2024-02-26 ENCOUNTER — Other Ambulatory Visit: Payer: Self-pay

## 2024-02-28 ENCOUNTER — Other Ambulatory Visit: Payer: Self-pay

## 2024-03-01 ENCOUNTER — Other Ambulatory Visit: Payer: Self-pay

## 2024-03-22 ENCOUNTER — Other Ambulatory Visit (INDEPENDENT_AMBULATORY_CARE_PROVIDER_SITE_OTHER): Payer: Self-pay

## 2024-03-22 DIAGNOSIS — I739 Peripheral vascular disease, unspecified: Secondary | ICD-10-CM

## 2024-03-23 ENCOUNTER — Other Ambulatory Visit (INDEPENDENT_AMBULATORY_CARE_PROVIDER_SITE_OTHER)

## 2024-03-23 ENCOUNTER — Encounter (INDEPENDENT_AMBULATORY_CARE_PROVIDER_SITE_OTHER): Payer: Self-pay

## 2024-03-23 DIAGNOSIS — I739 Peripheral vascular disease, unspecified: Secondary | ICD-10-CM | POA: Diagnosis not present

## 2024-03-31 ENCOUNTER — Other Ambulatory Visit: Payer: Self-pay

## 2024-03-31 ENCOUNTER — Other Ambulatory Visit (HOSPITAL_COMMUNITY): Payer: Self-pay

## 2024-03-31 MED ORDER — ASPIRIN 81 MG PO CHEW
81.0000 mg | CHEWABLE_TABLET | Freq: Every day | ORAL | 1 refills | Status: AC
  Filled 2024-03-31: qty 90, 90d supply, fill #0

## 2024-04-01 ENCOUNTER — Other Ambulatory Visit (HOSPITAL_COMMUNITY): Payer: Self-pay

## 2024-04-01 ENCOUNTER — Other Ambulatory Visit: Payer: Self-pay

## 2024-04-06 ENCOUNTER — Other Ambulatory Visit: Payer: Self-pay

## 2024-04-07 ENCOUNTER — Other Ambulatory Visit (HOSPITAL_COMMUNITY): Payer: Self-pay

## 2024-04-07 ENCOUNTER — Other Ambulatory Visit: Payer: Self-pay

## 2024-04-07 MED ORDER — ELIQUIS 2.5 MG PO TABS
2.5000 mg | ORAL_TABLET | Freq: Two times a day (BID) | ORAL | 1 refills | Status: AC
  Filled 2024-04-07 (×2): qty 180, 90d supply, fill #0

## 2024-04-07 MED ORDER — TRAZODONE HCL 50 MG PO TABS
50.0000 mg | ORAL_TABLET | Freq: Every day | ORAL | 3 refills | Status: AC
  Filled 2024-04-07: qty 90, 90d supply, fill #0
